# Patient Record
Sex: Female | Born: 1955 | Race: Black or African American | Hispanic: No | Marital: Married | Smoking: Former smoker
Health system: Southern US, Community
[De-identification: ages and names within clinical notes are randomized; demographics above are authoritative.]

## PROBLEM LIST (undated history)

## (undated) ENCOUNTER — Emergency Department (HOSPITAL_COMMUNITY): Admission: EM | Payer: BC Managed Care – PPO | Source: Home / Self Care

## (undated) DIAGNOSIS — C50919 Malignant neoplasm of unspecified site of unspecified female breast: Secondary | ICD-10-CM

## (undated) DIAGNOSIS — N200 Calculus of kidney: Secondary | ICD-10-CM

## (undated) DIAGNOSIS — Z9010 Acquired absence of unspecified breast and nipple: Secondary | ICD-10-CM

## (undated) DIAGNOSIS — G8929 Other chronic pain: Secondary | ICD-10-CM

## (undated) DIAGNOSIS — D649 Anemia, unspecified: Secondary | ICD-10-CM

## (undated) DIAGNOSIS — I10 Essential (primary) hypertension: Secondary | ICD-10-CM

## (undated) DIAGNOSIS — T7840XA Allergy, unspecified, initial encounter: Secondary | ICD-10-CM

## (undated) HISTORY — PX: CHOLECYSTECTOMY: SHX55

## (undated) HISTORY — PX: TUBAL LIGATION: SHX77

## (undated) HISTORY — DX: Essential (primary) hypertension: I10

## (undated) HISTORY — PX: BREAST SURGERY: SHX581

## (undated) HISTORY — DX: Malignant neoplasm of unspecified site of unspecified female breast: C50.919

## (undated) HISTORY — DX: Acquired absence of unspecified breast and nipple: Z90.10

## (undated) HISTORY — DX: Allergy, unspecified, initial encounter: T78.40XA

## (undated) HISTORY — DX: Calculus of kidney: N20.0

## (undated) HISTORY — DX: Other chronic pain: G89.29

## (undated) HISTORY — DX: Anemia, unspecified: D64.9

## (undated) HISTORY — PX: OTHER SURGICAL HISTORY: SHX169

## (undated) HISTORY — PX: TOTAL ABDOMINAL HYSTERECTOMY: SHX209

---

## 1992-08-03 HISTORY — PX: LAPAROSCOPIC LYSIS INTESTINAL ADHESIONS: SUR778

## 2003-08-04 HISTORY — PX: COLONOSCOPY: SHX174

## 2011-06-20 ENCOUNTER — Emergency Department (HOSPITAL_COMMUNITY): Payer: Self-pay

## 2011-06-20 ENCOUNTER — Emergency Department (HOSPITAL_COMMUNITY)
Admission: EM | Admit: 2011-06-20 | Discharge: 2011-06-20 | Disposition: A | Discharge status: Home / Self Care | Payer: Self-pay | Attending: Emergency Medicine | Admitting: Emergency Medicine

## 2011-06-20 DIAGNOSIS — N201 Calculus of ureter: Secondary | ICD-10-CM | POA: Insufficient documentation

## 2011-06-20 DIAGNOSIS — R109 Unspecified abdominal pain: Secondary | ICD-10-CM | POA: Insufficient documentation

## 2011-06-20 DIAGNOSIS — N1330 Unspecified hydronephrosis: Secondary | ICD-10-CM | POA: Insufficient documentation

## 2011-06-20 LAB — BASIC METABOLIC PANEL
BUN: 14 mg/dL (ref 6–23)
CO2: 30 mEq/L (ref 19–32)
Calcium: 9.7 mg/dL (ref 8.4–10.5)
Chloride: 95 mEq/L — ABNORMAL LOW (ref 96–112)
Creatinine, Ser: 1.06 mg/dL (ref 0.50–1.10)
GFR calc Af Amer: 67 mL/min — ABNORMAL LOW (ref >90)
GFR calc non Af Amer: 58 mL/min — ABNORMAL LOW (ref >90)
Glucose, Bld: 173 mg/dL — ABNORMAL HIGH (ref 70–99)
Potassium: 3.1 mEq/L — ABNORMAL LOW (ref 3.5–5.1)
Sodium: 135 mEq/L (ref 135–145)

## 2011-06-20 LAB — URINALYSIS, ROUTINE W REFLEX MICROSCOPIC
Bilirubin Urine: NEGATIVE
Glucose, UA: NEGATIVE mg/dL
Hgb urine dipstick: NEGATIVE
Ketones, ur: NEGATIVE mg/dL
Leukocytes, UA: NEGATIVE
Nitrite: NEGATIVE
Protein, ur: NEGATIVE mg/dL
Specific Gravity, Urine: 1.028 (ref 1.005–1.030)
Urobilinogen, UA: 1 mg/dL (ref 0.0–1.0)
pH: 6 (ref 5.0–8.0)

## 2011-06-20 LAB — CBC
HCT: 34 % — ABNORMAL LOW (ref 36.0–46.0)
Hemoglobin: 11.2 g/dL — ABNORMAL LOW (ref 12.0–15.0)
MCH: 27.1 pg (ref 26.0–34.0)
MCHC: 32.9 g/dL (ref 30.0–36.0)
MCV: 82.3 fL (ref 78.0–100.0)
Platelets: 196 10*3/uL (ref 150–400)
RBC: 4.13 MIL/uL (ref 3.87–5.11)
RDW: 14.3 % (ref 11.5–15.5)
WBC: 8.5 10*3/uL (ref 4.0–10.5)

## 2011-06-20 MED ORDER — OXYCODONE-ACETAMINOPHEN 5-325 MG PO TABS: 1.0000 | ORAL_TABLET | ORAL | Status: AC | PRN

## 2011-06-20 MED ORDER — ONDANSETRON HCL 4 MG/2ML IJ SOLN
4.0000 mg | Freq: Once | INTRAMUSCULAR | Status: AC
  Administered 2011-06-20: 4 mg via INTRAVENOUS
  Filled 2011-06-20: qty 2

## 2011-06-20 MED ORDER — ONDANSETRON HCL 4 MG PO TABS
4.0000 mg | ORAL_TABLET | Freq: Four times a day (QID) | ORAL | Status: AC
Start: 2011-06-20 — End: 2011-06-27

## 2011-06-20 MED ORDER — KETOROLAC TROMETHAMINE 30 MG/ML IJ SOLN
INTRAMUSCULAR | Status: AC
  Administered 2011-06-20: 15 mg
  Filled 2011-06-20: qty 1

## 2011-06-20 MED ORDER — POTASSIUM CHLORIDE CRYS ER 20 MEQ PO TBCR
40.0000 meq | EXTENDED_RELEASE_TABLET | Freq: Once | ORAL | Status: AC
  Administered 2011-06-20: 40 meq via ORAL
  Filled 2011-06-20: qty 2

## 2011-06-20 MED ORDER — HYDROMORPHONE HCL PF 1 MG/ML IJ SOLN: 1.0000 mg | Freq: Once | INTRAMUSCULAR | Status: DC

## 2011-06-20 MED ORDER — SODIUM CHLORIDE 0.9 % IV BOLUS (SEPSIS)
1000.0000 mL | Freq: Once | INTRAVENOUS | Status: AC
  Administered 2011-06-20: 1000 mL via INTRAVENOUS

## 2011-06-20 MED ORDER — KETOROLAC TROMETHAMINE 15 MG/ML IJ SOLN
15.0000 mg | INTRAMUSCULAR | Status: DC
  Filled 2011-06-20: qty 1

## 2011-06-20 NOTE — ED Provider Notes (Signed)
History    55yF with R flank and RLQ pain. Onset Saturday. Initially vomiting which resolved after first day. Occasional radiation to back. Relatively constant without appreciable exacerbating or relieving factors. Intermittent nausea since then though. No fever or chills. Went to an [facility] and tx'd presumptively as kidney stone. Prescribed pain meds. Called them on Wednesday because of continued symptoms and prescribed cipro which has been taking, now on day 3. No hx of similar pain. Pt denies having urinary symptoms though then or now. No vaginal bleeding or discharge. Multiple abdominal surgeries including c-section x3, hysterectomy, chole. No recent procedures.  CSN: [id number] Arrival date & time: 06/20/2011 12:39 PM   First MD Initiated Contact with Patient 06/20/11 1509      Chief Complaint  Patient presents with  . Flank Pain    right flank pain and nausea states onset last week states was told she had a kidney stone denies difficulty urinating  . Nausea    (Consider location/radiation/quality/duration/timing/severity/associated sxs/prior treatment) HPI  Past Medical History  Diagnosis Date  . Cancer     Past Surgical History  Procedure Date  . Cholecystectomy   . Breast surgery     No family history on file.  History  Substance Use Topics  . Smoking status: Never Smoker   . Smokeless tobacco: Not on file  . Alcohol Use: No    OB History    Grav Para Term Preterm Abortions TAB SAB Ect Mult Living                  Review of Systems  Review of symptoms negative unless otherwise noted in HPI.  Allergies  Codeine and Iodine  Home Medications   Current Outpatient Rx  Name Route Sig Dispense Refill  . ATENOLOL 50 MG PO TABS Oral Take 50 mg by mouth daily.      Marland Kitchen CIPROFLOXACIN HCL 500 MG PO TABS Oral Take 500 mg by mouth 2 (two) times daily. Started 14 day course on 06/18/11     . LOSARTAN POTASSIUM-HCTZ 100-25 MG PO TABS Oral Take 1 tablet by mouth daily.        . TRAMADOL HCL 50 MG PO TABS Oral Take 50 mg by mouth every 6 (six) hours as needed. Pain..Maximum dose= 8 tablets per day     . OXYCODONE-ACETAMINOPHEN 5-325 MG PO TABS Oral Take 1 tablet by mouth every 4 (four) hours as needed for pain. 10 tablet 0    BP 104/63  Pulse 74  Temp(Src) 98.5 F (36.9 C) (Oral)  Resp 19  SpO2 100%  Physical Exam  Nursing note and vitals reviewed. Constitutional: She appears well-developed and well-nourished. No distress.       obese  HENT:  Head: Normocephalic and atraumatic.  Eyes: Conjunctivae are normal. Right eye exhibits no discharge. Left eye exhibits no discharge.  Neck: Neck supple.  Cardiovascular: Normal rate, regular rhythm and normal heart sounds.  Exam reveals no gallop and no friction rub.   No murmur heard. Pulmonary/Chest: Effort normal and breath sounds normal. No respiratory distress.  Abdominal: Soft. She exhibits no distension and no mass. There is no tenderness.  Genitourinary:       No cva tenderness  Musculoskeletal: She exhibits no edema and no tenderness.  Neurological: She is alert.  Skin: Skin is warm and dry.  Psychiatric: She has a normal mood and affect. Her behavior is normal. Thought content normal.    ED Course  Procedures (including critical care time)  Labs Reviewed  URINALYSIS, ROUTINE W REFLEX MICROSCOPIC - Abnormal; Notable for the following:    Appearance CLOUDY (*)    All other components within normal limits  CBC - Abnormal; Notable for the following:    Hemoglobin 11.2 (*)    HCT 34.0 (*)    All other components within normal limits  BASIC METABOLIC PANEL - Abnormal; Notable for the following:    Potassium 3.1 (*)    Chloride 95 (*)    Glucose, Bld 173 (*)    GFR calc non Af Amer 58 (*)    GFR calc Af Amer 67 (*)    All other components within normal limits   Ct Abdomen Pelvis W Contrast  06/20/2011  *RADIOLOGY REPORT*  Clinical Data:  Right flank pain.  Renal colic.  Nausea.  CT OF THE  ABDOMEN AND PELVIS WITHOUT CONTRAST (CT UROGRAM)  Technique:  Multidetector CT imaging was performed through the abdomen and pelvis to include the urinary tract.  Comparison:  None  Findings:  Mild right hydronephrosis and ureterectasis is seen. The 3 mm distal right ureteral calculus is seen.  A small less than 5 mm intrarenal calculus is also seen in the lower pole of the right kidney.  No evidence of left renal or ureteral calculi or left-sided hydronephrosis.  The other abdominal parenchymal organs have a normal appearance on this noncontrast study.  Surgical clips seen from prior cholecystectomy.  Uterus and adnexa are unremarkable.  No evidence of mass or inflammatory process.  IMPRESSION:  1.  Mild right hydronephrosis and ureterectasis due to a 3 mm distal right ureteral calculus. 2.  Nonobstructing right nephrolithiasis.  Original Report Authenticated By: Danae Orleans, M.D.     1. Hydronephrosis   2. Abdominal pain   3. Ureteral stone       MDM  55yf with flank pain. Likely renal colic. 3mm nonobstructing stone at UVJ. Given size anticipate spontaneous passage. UA with no evidence of infection. Renal function normal. Pain adequately controlled. Plan pain meds and expectant management. Follow-up with urology as needed.      Raeford Razor, MD 06/20/11 [phone]

## 2011-12-17 ENCOUNTER — Ambulatory Visit: Payer: Self-pay | Admitting: Family Medicine

## 2011-12-17 ENCOUNTER — Encounter: Payer: Self-pay | Admitting: Family Medicine

## 2011-12-17 DIAGNOSIS — I10 Essential (primary) hypertension: Secondary | ICD-10-CM | POA: Insufficient documentation

## 2011-12-17 DIAGNOSIS — E119 Type 2 diabetes mellitus without complications: Secondary | ICD-10-CM | POA: Insufficient documentation

## 2011-12-17 DIAGNOSIS — J45909 Unspecified asthma, uncomplicated: Secondary | ICD-10-CM | POA: Insufficient documentation

## 2012-01-27 ENCOUNTER — Ambulatory Visit (INDEPENDENT_AMBULATORY_CARE_PROVIDER_SITE_OTHER): Payer: BC Managed Care – PPO | Admitting: Family Medicine

## 2012-01-27 ENCOUNTER — Other Ambulatory Visit: Payer: Self-pay | Admitting: Family Medicine

## 2012-01-27 ENCOUNTER — Encounter: Payer: Self-pay | Admitting: Family Medicine

## 2012-01-27 VITALS — BP 128/83 | HR 92 | Ht 63.0 in | Wt 187.0 lb

## 2012-01-27 DIAGNOSIS — I10 Essential (primary) hypertension: Secondary | ICD-10-CM

## 2012-01-27 DIAGNOSIS — IMO0001 Reserved for inherently not codable concepts without codable children: Secondary | ICD-10-CM

## 2012-01-27 DIAGNOSIS — R1032 Left lower quadrant pain: Secondary | ICD-10-CM

## 2012-01-27 DIAGNOSIS — E119 Type 2 diabetes mellitus without complications: Secondary | ICD-10-CM

## 2012-01-27 DIAGNOSIS — C50919 Malignant neoplasm of unspecified site of unspecified female breast: Secondary | ICD-10-CM

## 2012-01-27 NOTE — Patient Instructions (Signed)
Dear Erica Campbell,   It was great to see you today. Thank you for coming to clinic. Please read below regarding the issues that we discussed.   1. For your pelvic pain that seems to be related to your back pain, I want to get x-rays of your spine and hips. You can go to [place] at the front entrance to do these on Friday.  2. We are going to try to get records.   Please follow up in clinic in 1-2 weeks to discuss the results and to do more of your chronic health maintenance. Please call earlier if you have any questions or concerns.   Sincerely,  Dr. Tana Conch

## 2012-01-28 DIAGNOSIS — Z853 Personal history of malignant neoplasm of breast: Secondary | ICD-10-CM | POA: Insufficient documentation

## 2012-01-28 NOTE — Assessment & Plan Note (Signed)
Well controlled at this time. Will follow 

## 2012-01-28 NOTE — Assessment & Plan Note (Signed)
Unclear origin. Appears likely MSK given worsened on days after exertional activity. Also seems linked to back pain-will obtain x-rays of spine and hip. Doubt gyn issue given hysterectomy. Benign abdominal exam. Patient concerned about spleen but looking at CT from when patient had a kidney stone in last year-no abnormalities noted.   INtended to obtain records-will need to on future visit.

## 2012-01-28 NOTE — Progress Notes (Signed)
  Subjective:    Patient ID: Erica Campbell, female    DOB: 1955/08/31, 56 y.o.   MRN: [id number]  HPI Patient here for new patient visit. Primary concern is left back/groin pain.  1. LLQ pain/groin pain. atient states that she has been experiencing discomfort in her pelvis/groin that started  2 years when she moved to [facility]. She has had left sided back pain for a period longer than this. She describes pain in an aching/annoying 3-4/10 pain in her LLQ/groin. SHe thinks the pain may start in her back at times and radiate around to her groin. Pain is intermittent but she feels it at least once a day when she is resting in the evening. She says if she is busy/occupied at work she doesn't notice it. Worsened on days after physical exertion but not during exertion. Also worsened by constipation. Patient has a hysterectomy so does not have mesntrual period association nor abnormal bleeding. She does say sex can be uncomfortable at times but uncertain if this has been going on longer than pelvic pain. 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 Patient also states that she has been under a lot of stress and that she is more aware of pain when she is stressed.   2. DM-diagnosed within last 2 years. Just started metformin 2 weeks ago after visiting an urgent care. a1c 7.5. Patient not tolerating metformin given her abdominal pain. She says it causes blaoating which makes her uncomfortable and worsens pain. SHe does have a BM everyday on the metformin though so constipation not worsening above pain. Was started on 500mg  BID.   3. HTN-complaint with medications. Patient surprised it is controlled today but excited.   Review of Systems -See HPI  Past Medical History-smoking status noted: 3 pack years quit over 30 years ago Updated records with new patient information in Epic.  Marland Kitchen Reviewed problem list.    Medications- reviewed and updated Chief complaint-noted    Objective:   Physical Exam  Constitutional: She is oriented to person, place, and time. She appears well-developed and well-nourished. No distress.  Eyes: Conjunctivae and EOM are normal. Pupils are equal, round, and reactive to light.  Neck: Normal range of motion. Neck supple.  Cardiovascular: Normal rate and regular rhythm.  Exam reveals no gallop and no friction rub.   No murmur heard. Pulmonary/Chest: Effort normal and breath sounds normal. She has no wheezes. She has no rales.  Abdominal: Soft. Bowel sounds are normal. She exhibits no distension.       No tenderness with deep palpation.   Musculoskeletal: Normal range of motion. She exhibits no edema.       No pain with hip flexion, internal or external rotation.   Neurological: She is alert and oriented to person, place, and time.  Skin: Skin is warm and dry.       Assessment & Plan:

## 2012-01-28 NOTE — Assessment & Plan Note (Signed)
Instructed patient to stop taking metformin for 1 week to see if this helps with pain. SHe is to restart medicine at 500 mg once a day then follow up with me and we may titrate up to BID. Will need to assess if patient has had DM education at next visit. A1c with reasonable control at 7.5 but goal of 7 or less in this patient.

## 2012-06-17 ENCOUNTER — Ambulatory Visit (INDEPENDENT_AMBULATORY_CARE_PROVIDER_SITE_OTHER): Payer: BC Managed Care – PPO | Admitting: Family Medicine

## 2012-06-17 ENCOUNTER — Encounter: Payer: Self-pay | Admitting: Family Medicine

## 2012-06-17 VITALS — BP 139/81 | HR 93 | Temp 99.3°F | Ht 63.0 in | Wt 192.0 lb

## 2012-06-17 DIAGNOSIS — J329 Chronic sinusitis, unspecified: Secondary | ICD-10-CM | POA: Insufficient documentation

## 2012-06-17 MED ORDER — AMOXICILLIN 500 MG PO CAPS: 500.0000 mg | ORAL_CAPSULE | Freq: Two times a day (BID) | ORAL | Status: DC

## 2012-06-17 NOTE — Patient Instructions (Addendum)
See info on sinusitis Make follow-up for follow-up medical care with your primary doctor I recommend  A flu shot for you every year

## 2012-06-17 NOTE — Progress Notes (Signed)
  Subjective:    Patient ID: Erica Campbell, female    DOB: 1956-05-24, 56 y.o.   MRN: [id number]  HPI  7 week of sinus pressure, worsening in past day  Felt low grade fever.  Fatigue, dyspnea, wheezing, eye and ear pain.  Sore throat.  + cough, mild.  Is most bothered by eye and face pressure.  oOtes nasal congestion rhinorrhea left greater than right.  I have reviewed patient's  PMH, FH, and Social history and Medications as related to this visit. Asthma:  Non smoker, no flu shot.    Review of Systemssee hpi     Objective:   Physical Exam  GEN: Alert & Oriented, No acute distress HEENT: [place]/AT. EOMI, PERRLA, no conjunctival injection or scleral icterus.  Bilateral tympanic membranes intact without erythema or effusion.  .  Nares without edema or rhinorrhea.  Oropharynx is without erythema or exudates.  No anterior or posterior cervical lymphadenopathy. CV:  Regular Rate & Rhythm, no murmur Respiratory:  Normal work of breathing, CTAB        Assessment & Plan:

## 2012-06-17 NOTE — Assessment & Plan Note (Signed)
Sinusitis with post nasal drainage causing cough.  Given 7 day duration and now worsening instead of improving, course of amoxicillin + symptomatic therapy.  Given handout on sinusitis

## 2012-11-21 ENCOUNTER — Encounter: Payer: Self-pay | Admitting: Family Medicine

## 2012-11-22 ENCOUNTER — Ambulatory Visit (INDEPENDENT_AMBULATORY_CARE_PROVIDER_SITE_OTHER): Payer: BC Managed Care – PPO | Admitting: Family Medicine

## 2012-11-22 ENCOUNTER — Encounter: Payer: Self-pay | Admitting: Family Medicine

## 2012-11-22 VITALS — BP 133/84 | HR 93 | Temp 98.6°F | Wt 189.0 lb

## 2012-11-22 DIAGNOSIS — I10 Essential (primary) hypertension: Secondary | ICD-10-CM

## 2012-11-22 DIAGNOSIS — E785 Hyperlipidemia, unspecified: Secondary | ICD-10-CM

## 2012-11-22 DIAGNOSIS — R5381 Other malaise: Secondary | ICD-10-CM

## 2012-11-22 DIAGNOSIS — R5383 Other fatigue: Secondary | ICD-10-CM

## 2012-11-22 DIAGNOSIS — E559 Vitamin D deficiency, unspecified: Secondary | ICD-10-CM

## 2012-11-22 DIAGNOSIS — E119 Type 2 diabetes mellitus without complications: Secondary | ICD-10-CM

## 2012-11-22 DIAGNOSIS — J45909 Unspecified asthma, uncomplicated: Secondary | ICD-10-CM

## 2012-11-22 DIAGNOSIS — J309 Allergic rhinitis, unspecified: Secondary | ICD-10-CM | POA: Insufficient documentation

## 2012-11-22 DIAGNOSIS — Z8639 Personal history of other endocrine, nutritional and metabolic disease: Secondary | ICD-10-CM

## 2012-11-22 LAB — CBC
HCT: 37.5 % (ref 36.0–46.0)
MCH: 27.2 pg (ref 26.0–34.0)
MCV: 80.8 fL (ref 78.0–100.0)
RDW: 15.4 % (ref 11.5–15.5)
WBC: 6.9 10*3/uL (ref 4.0–10.5)

## 2012-11-22 LAB — TSH: TSH: 1.298 u[IU]/mL (ref 0.350–4.500)

## 2012-11-22 LAB — COMPREHENSIVE METABOLIC PANEL
AST: 20 U/L (ref 0–37)
BUN: 9 mg/dL (ref 6–23)
CO2: 26 mEq/L (ref 19–32)
Calcium: 9.8 mg/dL (ref 8.4–10.5)
Chloride: 99 mEq/L (ref 96–112)
Creat: 0.74 mg/dL (ref 0.50–1.10)

## 2012-11-22 MED ORDER — FEXOFENADINE HCL 60 MG PO TABS: 60.0000 mg | ORAL_TABLET | Freq: Every day | ORAL | Status: DC

## 2012-11-22 MED ORDER — METFORMIN HCL 500 MG PO TABS: 500.0000 mg | ORAL_TABLET | Freq: Two times a day (BID) | ORAL | Status: DC

## 2012-11-22 MED ORDER — ATENOLOL 50 MG PO TABS: 50.0000 mg | ORAL_TABLET | Freq: Every day | ORAL | Status: DC

## 2012-11-22 MED ORDER — LOSARTAN POTASSIUM-HCTZ 100-25 MG PO TABS: 1.0000 | ORAL_TABLET | Freq: Every day | ORAL | Status: DC

## 2012-11-22 MED ORDER — ALBUTEROL SULFATE HFA 108 (90 BASE) MCG/ACT IN AERS: 2.0000 | INHALATION_SPRAY | Freq: Four times a day (QID) | RESPIRATORY_TRACT | Status: DC | PRN

## 2012-11-22 NOTE — Patient Instructions (Signed)
1. For your diabetes.   *we are sending you to diabetes nutrition.   *Start metformin 1/2 tab in the morning and increase to 1 tab in the morning if no abdominal issues. Then in another week increase to 1/2 tab in evening then in another week a full tab until you are taking 1 tab in morning and evening.   *let me know which meter your insurance covers as I want to start you on another medication that can cause low blood sugars (glipizide)  *an aspirin on a daily basis would be good for you due to your diabetes  2. For blood pressure-appears well controlled today. Continue current meds.   3. For asthma, appears well controlled. I refilled your albuterol.  4. For seasonal allergies, we started allegra.   I am checking several labs today and will send you a letter if normal and call if any changes need to be made.   Please see me within 2-3 weeks so we can see how things are going with each of these,  Dr. Durene Cal  P.s. We tried to get records again today.

## 2012-11-23 DIAGNOSIS — Z8639 Personal history of other endocrine, nutritional and metabolic disease: Secondary | ICD-10-CM | POA: Insufficient documentation

## 2012-11-23 DIAGNOSIS — E785 Hyperlipidemia, unspecified: Secondary | ICD-10-CM | POA: Insufficient documentation

## 2012-11-23 LAB — VITAMIN D 25 HYDROXY (VIT D DEFICIENCY, FRACTURES): Vit D, 25-Hydroxy: 22 ng/mL — ABNORMAL LOW (ref 30–89)

## 2012-11-23 MED ORDER — CHOLECALCIFEROL 10 MCG (400 UNIT) PO CAPS: 2.0000 | ORAL_CAPSULE | Freq: Every day | ORAL | Status: DC

## 2012-11-23 MED ORDER — BLOOD GLUCOSE METER KIT: PACK | Status: DC

## 2012-11-23 MED ORDER — GLUCOSE BLOOD VI STRP: ORAL_STRIP | Status: DC

## 2012-11-23 MED ORDER — ACCU-CHEK SOFTCLIX LANCETS MISC: Status: DC

## 2012-11-23 NOTE — Assessment & Plan Note (Signed)
Poorly controlled. Start allegra.

## 2012-11-23 NOTE — Assessment & Plan Note (Signed)
*  start metformin (titrate up from 250 daily)  *send to DM nutrition *patient to let me know which meter her insurance covers then I will order that. After she has it available, will also start glipizide *an aspirin on a daily basis would be good for you due to your diabetes *patient to return in 2-3 weeks to catch up on other DM health maintenance needs *will alert [facility] team due to poorly controlled DM

## 2012-11-23 NOTE — Progress Notes (Signed)
Subjective:   1.  DIABETES Type II Medications taking and tolerating-on no medications, had abdominal discomfort with metformin but was also having chronic abdominal pain about a year ago which has since resolved. Had ordered films last year which patient did not complete.  Blood Sugars per patient-no meter Diet-multiple dietary indescretions Regular Exercise-no   Last eye exam-seen in January of this year, will ask eye doctor to send recores Last foot exam-not within last 2 years  Last microalbumin/on ace inhibitor-on arm Pneumovax-has never had On Aspirin-will start today On statin-LDL 107 and patient considering Daily foot monitoring-yes  ROS- Patient feels that urine output and thirst are at baseline. She does feel overall fatigued for at least the last 3 months though. Denies Vision changes, feet or hand numbness/pain/tingling. Denies  Hypoglycemia symptoms (shaky, sweaty, hungry,  anxious, tremor, palpitations, confusion, behavior change).   Hemoglobin a1c:  Lab Results  Component Value Date   HGBA1C 9.7 11/22/2012   HGBA1C 7.5 01/27/2012   2.  Hypertension- BP Readings from Last 3 Encounters:  11/22/12 133/84  06/17/12 139/81  01/27/12 128/83  Home BP monitoring-no Compliant with medications-yes without side effects (losartan-hctz and atenolol) Denies any CP, HA, SOB, blurry vision, LE edema (currently but does complain of it in the summertime), transient weakness, orthopnea, PND.    3. Asthma/Allergic rhinitis-patient with history of mild intermittent asthma. Uses albuterol max twice a week but this is infrequent and usually uses once a week. No chest pain or SOB or DOE at present. Has run out of albuterol. Seasonal allergies are starting to flare and also had a recent cold so has used 2x per week in last week. Previously on an antihistamine which seemed to help.    ROS--See HPI  Past Medical History Patient Active Problem List  Diagnosis  . Asthma  . Diabetes mellitus  type 2, noninsulin dependent  . HTN (hypertension)  . Groin pain, left lower quadrant  . Breast cancer  . Allergic rhinitis   Reviewed problem list.  Medications- reviewed and updated Chief complaint-noted  Objective: BP 133/84  Pulse 93  Temp(Src) 98.6 F (37 C) (Oral)  Wt 189 lb (85.73 kg)  BMI 33.49 kg/m2 Gen: NAD, resting comfortably CV: RRR no murmurs rubs or gallops Lungs: CTAB no crackles, wheeze, rhonchi Skin: warm, dry Neuro: grossly normal, moves all extremities  Assessment/Plan:  Discussed dependent edema with patient-not major cause for concern without history of CHF and no related DOE, SOB, orthopnea, PND. Advised patient in summer time to use compression stockings, exercise, and elevate legs.   >50% of 30 minute office visit spent on counseling related to worsened DM and distress related to this.

## 2012-11-23 NOTE — Assessment & Plan Note (Signed)
Mild intermittent-well controlled. Refilled albuterol.

## 2012-11-23 NOTE — Assessment & Plan Note (Signed)
Well controlled. Continue current medications. With plan to start glipizide, patient may benefit from coming off atenolol and starting amlodipine to avoid masking symptoms of hypoglycemia.

## 2012-11-23 NOTE — Assessment & Plan Note (Signed)
Patient wants to think over use of statin for now. Recommended lovastatin for patient given DM.

## 2012-12-01 ENCOUNTER — Encounter: Payer: Self-pay | Admitting: Family Medicine

## 2012-12-06 ENCOUNTER — Encounter: Payer: BC Managed Care – PPO | Attending: Family Medicine | Admitting: *Deleted

## 2012-12-06 VITALS — Ht 63.0 in | Wt 188.5 lb

## 2012-12-06 DIAGNOSIS — Z713 Dietary counseling and surveillance: Secondary | ICD-10-CM | POA: Insufficient documentation

## 2012-12-06 DIAGNOSIS — E1165 Type 2 diabetes mellitus with hyperglycemia: Secondary | ICD-10-CM

## 2012-12-06 DIAGNOSIS — E119 Type 2 diabetes mellitus without complications: Secondary | ICD-10-CM | POA: Insufficient documentation

## 2012-12-08 ENCOUNTER — Encounter: Payer: Self-pay | Admitting: Family Medicine

## 2012-12-08 NOTE — Progress Notes (Signed)
Received records from [facility]   Only records included showed normal colonoscopy in 2005 for vague abdominal pain, normal gall bladder pathology from chronic cholecystitis and op note from this procedure.   Appears patient has had vague abdominal pain for sometime but apart from above information, no other workup known. Unclear if patient had pcp at [facility].

## 2012-12-10 ENCOUNTER — Encounter: Payer: Self-pay | Admitting: *Deleted

## 2012-12-10 NOTE — Progress Notes (Signed)
Patient was seen on 12/06/2012 for the first of a series of three diabetes self-management courses [facility]. Patient's most recent A1c was 9.7 % on 11/22/2012 The following learning objectives were met by the patient during this course:   Defines the role of glucose and insulin  Identifies type of diabetes and pathophysiology  Defines the diagnostic criteria for diabetes and prediabetes  States the risk factors for Type 2 Diabetes  States the symptoms of Type 2 Diabetes  Defines Type 2 Diabetes treatment goals  Defines Type 2 Diabetes treatment options  States the rationale for glucose monitoring  Identifies A1C, glucose targets, and testing times  Identifies proper sharps disposal  Defines the purpose of a diabetes food plan  Identifies carbohydrate food groups  Defines effects of carbohydrate foods on glucose levels  Identifies carbohydrate choices/grams/food labels  States benefits of physical activity and effect on glucose  Review of suggested activity guidelines  Handouts given during class include:  Type 2 Diabetes: Basics Book  My Food Plan Book  Food and Activity Log   Follow-Up Plan: Core Class 2

## 2012-12-10 NOTE — Patient Instructions (Signed)
Goals:  Follow Diabetes Meal Plan as instructed  Eat 3 meals and 2 snacks, every 3-5 hrs  Limit carbohydrate intake to 30-45 grams carbohydrate/meal  Limit carbohydrate intake to 0-15 grams carbohydrate/snack  Add lean protein foods to meals/snacks  Monitor glucose levels as instructed by your doctor  Aim for 15-30 mins of physical activity daily  Bring food record and glucose log to your next nutrition visit   

## 2012-12-22 NOTE — Progress Notes (Signed)
Patient is currently enrolled in DM education classes with [facility].  Once she is finished if she feels she needs continued support please refer to health coach for support.

## 2013-01-07 DIAGNOSIS — I10 Essential (primary) hypertension: Secondary | ICD-10-CM | POA: Insufficient documentation

## 2013-01-10 ENCOUNTER — Encounter: Payer: BC Managed Care – PPO | Attending: Family Medicine | Admitting: *Deleted

## 2013-01-10 DIAGNOSIS — E119 Type 2 diabetes mellitus without complications: Secondary | ICD-10-CM | POA: Insufficient documentation

## 2013-01-10 DIAGNOSIS — Z713 Dietary counseling and surveillance: Secondary | ICD-10-CM | POA: Insufficient documentation

## 2013-01-11 NOTE — Progress Notes (Signed)
  Patient was seen on 01/10/2013 for the second of a series of three diabetes self-management courses at [facility]. The following learning objectives were met by the patient during this course:   Explain basic nutrition maintenance and quality assurance  Describe causes, symptoms and treatment of hypoglycemia and hyperglycemia  Explain how to manage diabetes during illness  Describe the importance of good nutrition for health and healthy eating strategies  List strategies to follow meal plan when dining out  Describe the effects of alcohol on glucose and how to use it safely  Describe problem solving skills for day-to-day glucose challenges  Describe strategies to use when treatment plan needs to change  Identify important factors involved in successful weight loss  Describe ways to remain physically active  Describe the impact of regular activity on insulin resistance    Handouts given in class:  Refrigerator magnet for Sick Day Guidelines  [facility] Oral medication/insulin handout  Follow-Up Plan: Patient will attend the final class of the ADA Diabetes Self-Care Education.    

## 2013-01-13 ENCOUNTER — Encounter: Payer: Self-pay | Admitting: Family Medicine

## 2013-01-13 ENCOUNTER — Ambulatory Visit (INDEPENDENT_AMBULATORY_CARE_PROVIDER_SITE_OTHER): Payer: BC Managed Care – PPO | Admitting: Family Medicine

## 2013-01-13 VITALS — BP 128/84 | HR 101 | Ht 63.0 in | Wt 181.7 lb

## 2013-01-13 DIAGNOSIS — E119 Type 2 diabetes mellitus without complications: Secondary | ICD-10-CM

## 2013-01-13 DIAGNOSIS — E1165 Type 2 diabetes mellitus with hyperglycemia: Secondary | ICD-10-CM

## 2013-01-13 DIAGNOSIS — I10 Essential (primary) hypertension: Secondary | ICD-10-CM

## 2013-01-13 DIAGNOSIS — E785 Hyperlipidemia, unspecified: Secondary | ICD-10-CM

## 2013-01-13 DIAGNOSIS — Z23 Encounter for immunization: Secondary | ICD-10-CM

## 2013-01-13 MED ORDER — LOVASTATIN 20 MG PO TABS: 40.0000 mg | ORAL_TABLET | Freq: Every day | ORAL | Status: DC

## 2013-01-13 NOTE — Assessment & Plan Note (Signed)
After lengthy discussion on CV risk, patient agreeable to start statin. Lovastatin sent to [facility] (at 40mg  qualifies as moderate intensity statin).

## 2013-01-13 NOTE — Progress Notes (Signed)
Subjective:   1.  DIABETES Type II Medications taking and tolerating-metformin 500mg  twice a day (occasionally misses evening dose)  Blood Sugars per patient-averaging around 160 (checks varying time of day). Over 200 only once in past 3 weeks.  Diet-in diabetes education. Much better food choices. Also has a Writer" who has had DM for 10 years.  Regular Exercise-no   Last eye exam-seen in January of this year, [facility] (will get ROI today) Last foot exam-01/13/13 Last microalbumin/on ace inhibitor-on arb Pneumovax-01/13/13 On Aspirin-yes On statin-will start today Daily foot monitoring-yes  ROS- denies polyuria and polydipsia. No blurry vision. No feet or hand numbness/pain/tingling. Denies  Hypoglycemia symptoms (shaky, sweaty, hungry,  anxious, tremor, palpitations, confusion, behavior change).   Hemoglobin a1c:  Lab Results  Component Value Date   HGBA1C 9.7 11/22/2012   HGBA1C 7.5 01/27/2012   Health Maintenance Due  Topic Date Due  . Pneumococcal Polysaccharide Vaccine (#1)-today 03/24/1957  . Foot Exam -today 03/24/1965  . Ophthalmology Exam -get records 03/24/1965  . Pap Smear -needs to come back for this within a month 03/24/1973  . Tetanus/tdap -today 03/24/1974  . Mammogram -handout given today 03/24/2005   # Hyperlipidemia-last  LDL 107. Patient wanted to consider before starting statin. No chest pain or shortness of breath.   ROS--See HPI  Past Medical History Patient Active Problem List   Diagnosis Date Noted  . Breast cancer     Priority: High  . Poorly controlled type 2 diabetes mellitus     Priority: High  . HTN (hypertension)     Priority: Medium  . Asthma     Priority: Low  . H/O vitamin D deficiency 11/23/2012  . Hyperlipidemia LDL goal < 100 11/23/2012  . Allergic rhinitis 11/22/2012  . Groin pain, left lower quadrant 01/27/2012   Reviewed problem list.  Medications- reviewed and updated Chief complaint-noted  Objective: BP  128/84  Pulse 101  Ht 5\' 3"  (1.6 m)  Wt 181 lb 11.2 oz (82.419 kg)  BMI 32.19 kg/m2 Gen: NAD, resting comfortably in chair CV: RRR no murmurs rubs or gallops Lungs: CTAB no crackles, wheeze, rhonchi Skin: warm, dry Neuro: grossly normal, moves all extremities Ext: no edema Foot exam: monofilament without signs of neuropathy, 2+ DP and PT pulses  Assessment/Plan:  >50% of 30 minute office visit spent on counseling on continued lifestyle changes for diabetes and hyperlipidemia as well as need to minimize CV risk. Also educated patient on topics such as DM health maintenance and calmed her fear on the number of steps we needed to take to maximize her health.

## 2013-01-13 NOTE — Patient Instructions (Addendum)
1. I am glad things are going so well with your diabetes education. See me in Late July and we will repeat your a1c. We may need to increase your medicine at that time but I am hopeful with your continued healthy lifestyle choices we can avoid this.   *remember to look at your feet and lotion them daily  2. Start taking the lovastatin for your cholesterol.   Health Maintenance Due  Topic Date Due  . Pneumococcal Polysaccharide Vaccine (#1)-you got today 03/24/1957  . Foot Exam -you got today 03/24/1965  . Ophthalmology Exam -we will try to get records 03/24/1965  . Pap Smear -please schedule an appointment for a well woman exam including breast exam within the month 03/24/1973  . Tetanus/tdap -you got today 03/24/1974  . Mammogram -please see handout and go get this completed (they send [place] the information) 03/24/2005   See me at my next available for well woman exam and see me in late July for diabetes exam, Dr. Paulette Blanch. i will look through your records soon as I recently received them

## 2013-01-13 NOTE — Assessment & Plan Note (Signed)
Improved control per #s reported (suspect a1c closer to 8). Moderate control. Patient able to tolerate metformin now with slow titration up. COntinue current medication. Has lost almost 10 lbs due to exercise/eating habits. Recheck a1c late July-may need to add glipizide.

## 2013-02-16 ENCOUNTER — Emergency Department (HOSPITAL_COMMUNITY)
Admission: EM | Admit: 2013-02-16 | Discharge: 2013-02-16 | Disposition: A | Discharge status: Home / Self Care | Payer: BC Managed Care – PPO | Attending: Emergency Medicine | Admitting: Emergency Medicine

## 2013-02-16 ENCOUNTER — Encounter (HOSPITAL_COMMUNITY): Payer: Self-pay

## 2013-02-16 DIAGNOSIS — I10 Essential (primary) hypertension: Secondary | ICD-10-CM | POA: Insufficient documentation

## 2013-02-16 DIAGNOSIS — Z9010 Acquired absence of unspecified breast and nipple: Secondary | ICD-10-CM | POA: Insufficient documentation

## 2013-02-16 DIAGNOSIS — Z853 Personal history of malignant neoplasm of breast: Secondary | ICD-10-CM | POA: Insufficient documentation

## 2013-02-16 DIAGNOSIS — K029 Dental caries, unspecified: Secondary | ICD-10-CM | POA: Insufficient documentation

## 2013-02-16 DIAGNOSIS — Z7982 Long term (current) use of aspirin: Secondary | ICD-10-CM | POA: Insufficient documentation

## 2013-02-16 DIAGNOSIS — J45909 Unspecified asthma, uncomplicated: Secondary | ICD-10-CM | POA: Insufficient documentation

## 2013-02-16 DIAGNOSIS — Z862 Personal history of diseases of the blood and blood-forming organs and certain disorders involving the immune mechanism: Secondary | ICD-10-CM | POA: Insufficient documentation

## 2013-02-16 DIAGNOSIS — E119 Type 2 diabetes mellitus without complications: Secondary | ICD-10-CM | POA: Insufficient documentation

## 2013-02-16 DIAGNOSIS — Z87891 Personal history of nicotine dependence: Secondary | ICD-10-CM | POA: Insufficient documentation

## 2013-02-16 DIAGNOSIS — G8929 Other chronic pain: Secondary | ICD-10-CM | POA: Insufficient documentation

## 2013-02-16 DIAGNOSIS — Z87442 Personal history of urinary calculi: Secondary | ICD-10-CM | POA: Insufficient documentation

## 2013-02-16 DIAGNOSIS — Z79899 Other long term (current) drug therapy: Secondary | ICD-10-CM | POA: Insufficient documentation

## 2013-02-16 DIAGNOSIS — Z8709 Personal history of other diseases of the respiratory system: Secondary | ICD-10-CM | POA: Insufficient documentation

## 2013-02-16 MED ORDER — TRAMADOL HCL 50 MG PO TABS: 50.0000 mg | ORAL_TABLET | Freq: Four times a day (QID) | ORAL | Status: DC | PRN

## 2013-02-16 MED ORDER — AMOXICILLIN 500 MG PO CAPS: 500.0000 mg | ORAL_CAPSULE | Freq: Three times a day (TID) | ORAL | Status: DC

## 2013-02-16 NOTE — ED Notes (Signed)
Pt. C/o dental pain x1 year.

## 2013-02-16 NOTE — ED Provider Notes (Signed)
Medical screening examination/treatment/procedure(s) were performed by non-physician practitioner and as supervising physician I was immediately available for consultation/collaboration.   Amara Manalang Ann Izella Ybanez, MD 02/16/13 2311 

## 2013-02-16 NOTE — ED Notes (Signed)
Pt. C/o dental pain from bilateral lower molars for approx 1 year on left side and 6 months on right side. Pt. Has cracked teeth and swollen gums. Pt. States she is "new to the area" and would like a referral to a dentist. Pt. Reports constant dull pain and increasing pain with chewing.

## 2013-02-16 NOTE — ED Provider Notes (Signed)
History    This chart was scribed for Wynetta Emery, non-physician practitioner working with Ashby Dawes, MD by Leone Payor, ED Scribe. This patient was seen in room TR09C/TR09C and the patient's care was started at 1924.  CSN: [id number] Arrival date & time 02/16/13  1924  First MD Initiated Contact with Patient 02/16/13 1932     Chief Complaint  Patient presents with  . Dental Pain    The history is provided by the patient. No language interpreter was used.    HPI Comments: Erica Campbell is a 57 y.o. female who presents to the Emergency Department complaining of ongoing, constant, gradually worsening bilateral lower molar dental pain for 1 year. States she has cracked teeth and has had tolerable pain up until a few days ago. Pt rates the pain as 3-4/10 at baseline but is excruciating when she attempts to eat anything. She has taken a dose of tramadol for pain with moderate relief. She is mostly concerned about a potential infection. She denies fever, nausea, vomiting.   Past Medical History  Diagnosis Date  . Breast cancer     1999 s/p mastectomy right breast only.   . Allergic rhinitis   . Anemia     only in past, not current problem  . Asthma     since 1978  . Chronic pain     pelvic  . Diabetes mellitus     2010  . HTN (hypertension)     1993  . Kidney stone     2012  . H/O mastectomy    Past Surgical History  Procedure Laterality Date  . Cholecystectomy      2007-chronic cholecystitis with cholelithiasis  . Breast surgery    . Total abdominal hysterectomy      with cervix left only.   . Tubal ligation      1981  . Cesarean section      x3  . H/o mastectomy     Family History  Problem Relation Age of Onset  . Asthma Mother   . Endometrial cancer      paternal grandmother  . Colon cancer      paternal aunt  . Hypertension Mother    History  Substance Use Topics  . Smoking status: Former Smoker -- 3 years    Quit date: 04/29/1975  .  Smokeless tobacco: Not on file  . Alcohol Use: Yes     Comment: special occasions only   OB History   Grav Para Term Preterm Abortions TAB SAB Ect Mult Living                 Review of Systems  Constitutional: Negative for fever.  HENT: Positive for dental problem. Negative for facial swelling.   Respiratory: Negative for shortness of breath.   Cardiovascular: Negative for chest pain.  Gastrointestinal: Negative for nausea, vomiting, abdominal pain and diarrhea.  All other systems reviewed and are negative.    Allergies  Codeine and Iodine  Home Medications   Current Outpatient Rx  Name  Route  Sig  Dispense  Refill  . albuterol (PROVENTIL HFA;VENTOLIN HFA) 108 (90 BASE) MCG/ACT inhaler   Inhalation   Inhale 2 puffs into the lungs every 6 (six) hours as needed for wheezing.   1 Inhaler   3   . aspirin 81 MG tablet   Oral   Take 81 mg by mouth daily.         Marland Kitchen atenolol (TENORMIN) 50 MG tablet  Oral   Take 1 tablet (50 mg total) by mouth daily.   30 tablet   11   . ACCU-CHEK SOFTCLIX LANCETS lancets      Check each morning then as needed for low blood sugar. Can be changed to relion or accu-chek lancets per insurance needs.   100 each   12     Can be changed to relion or accu-chek lancets per  ...   . Blood Glucose Monitoring Suppl (BLOOD GLUCOSE METER) kit      Check if symptoms low blood sugar. 1 meter-can be changed to relion or accu-chek meter per insurance needs.   1 each   0     1 meter-can be changed to relion or accu-chek mete ...   . fexofenadine (ALLEGRA) 60 MG tablet   Oral   Take 1 tablet (60 mg total) by mouth daily.   30 tablet   5   . glucose blood (ACCU-CHEK ACTIVE STRIPS) test strip      Check each morning then as needed for low blood sugar. Can be changed to relion or accu-chek strips per insurance needs.   100 each   12     Can be changed to relion or accu-chek strips per i ...   . losartan-hydrochlorothiazide (HYZAAR) 100-25  MG per tablet   Oral   Take 1 tablet by mouth daily.   30 tablet   11   . lovastatin (MEVACOR) 20 MG tablet   Oral   Take 2 tablets (40 mg total) by mouth at bedtime.   90 tablet   8     May substitute 40mg  pills (#30) if covered by insu ...   . metFORMIN (GLUCOPHAGE) 500 MG tablet   Oral   Take 1 tablet (500 mg total) by mouth 2 (two) times daily with a meal. Start 0.5 tab once daily with breakfast. Start full tab in 1 week   60 tablet   11    There were no vitals taken for this visit. Physical Exam  Nursing note and vitals reviewed. Constitutional: She is oriented to person, place, and time. She appears well-developed and well-nourished. No distress.  HENT:  Head: Normocephalic.  Mouth/Throat:    Generally poor dentition, no gingival swelling, erythema or tenderness to palpation. Patient is handling their secretions. There is no tenderness to palpation or firmness underneath tongue bilaterally. No trismus.    Eyes: Conjunctivae and EOM are normal.  Cardiovascular: Normal rate.   Pulmonary/Chest: Effort normal. No stridor.  Musculoskeletal: Normal range of motion.  Neurological: She is alert and oriented to person, place, and time.  Psychiatric: She has a normal mood and affect.    ED Course  Procedures (including critical care time)  DIAGNOSTIC STUDIES: None performed.   COORDINATION OF CARE: 7:49 PM Discussed treatment plan with pt at bedside and pt agreed to plan.   Labs Reviewed - No data to display No results found. No diagnosis found.  MDM   Filed Vitals:   02/16/13 1954  BP: 144/89  Pulse: 86  Temp: 99.4 F (37.4 C)  TempSrc: Oral  Resp: 16  SpO2: 98%     Erica Campbell is a 57 y.o. female Patient with toothache.  No gross abscess.  Exam unconcerning for Ludwig's angina or spread of infection.  Will treat with penicillin and pain medicine.  Urged patient to follow-up with dentist.    Pt is hemodynamically stable, appropriate for, and amenable  to discharge at this time. Pt verbalized  understanding and agrees with care plan. Outpatient follow-up and specific return precautions discussed.    New Prescriptions   AMOXICILLIN (AMOXIL) 500 MG CAPSULE    Take 1 capsule (500 mg total) by mouth 3 (three) times daily.   TRAMADOL (ULTRAM) 50 MG TABLET    Take 1 tablet (50 mg total) by mouth every 6 (six) hours as needed for pain.     Wynetta Emery, PA-C 02/16/13 2017

## 2013-04-26 ENCOUNTER — Telehealth: Payer: Self-pay | Admitting: Family Medicine

## 2013-04-26 DIAGNOSIS — E119 Type 2 diabetes mellitus without complications: Secondary | ICD-10-CM

## 2013-04-26 MED ORDER — GLUCOSE BLOOD VI STRP: ORAL_STRIP | Status: DC

## 2013-04-26 NOTE — Telephone Encounter (Signed)
Will forward to [place]. Mika Anastasi,CMA  

## 2013-04-26 NOTE — Telephone Encounter (Signed)
Pt called and would like a refill on her relon test strips sent to the pharmacy [facility] at [facility]. JW

## 2013-04-26 NOTE — Telephone Encounter (Signed)
Unclear why needed refill 100 strips with 11 refills in April. Refilled with year supply regardless.

## 2013-04-27 ENCOUNTER — Other Ambulatory Visit: Payer: Self-pay

## 2013-04-27 DIAGNOSIS — Z853 Personal history of malignant neoplasm of breast: Secondary | ICD-10-CM

## 2013-04-27 DIAGNOSIS — Z9011 Acquired absence of right breast and nipple: Secondary | ICD-10-CM

## 2013-04-27 DIAGNOSIS — Z1231 Encounter for screening mammogram for malignant neoplasm of breast: Secondary | ICD-10-CM

## 2013-05-18 ENCOUNTER — Telehealth: Payer: Self-pay | Admitting: Family Medicine

## 2013-05-18 NOTE — Telephone Encounter (Signed)
Pertinent information was scanned in on 10/7. It is oddly scanned in with a blank sheet on every other sheet when I did not include this to be scanned. I do not see a copy of her mammogram but the breast cancer can review pertinent information or repeat request specifically for mammograms.

## 2013-05-18 NOTE — Telephone Encounter (Signed)
Pt states that she had her records faxed to [place] from previous [place].  Do you have these on you>  Erica Campbell,CMA

## 2013-05-18 NOTE — Telephone Encounter (Signed)
Patient calls needing last mammogram sent to [facility]. Her appt is scheduled for 05/23/13 @ 2pm.

## 2013-05-18 NOTE — Telephone Encounter (Signed)
Pt's husband stopped by to see did Dr. Durene Cal receive the [facility] image from [facility].

## 2013-05-19 NOTE — Telephone Encounter (Signed)
Spoke with pt's husband and let him know message from Dr. Durene Cal.  He is aware and will inform pt. Advised them to call office where records are from and get mammogram sent to breast center. Jazmin Hartsell,CMA

## 2013-05-23 ENCOUNTER — Ambulatory Visit
Admission: RE | Admit: 2013-05-23 | Discharge: 2013-05-23 | Disposition: A | Discharge status: Home / Self Care | Payer: BC Managed Care – PPO | Source: Ambulatory Visit

## 2013-05-23 ENCOUNTER — Encounter: Payer: BC Managed Care – PPO | Attending: Family Medicine

## 2013-05-23 DIAGNOSIS — E1165 Type 2 diabetes mellitus with hyperglycemia: Secondary | ICD-10-CM

## 2013-05-23 DIAGNOSIS — Z1231 Encounter for screening mammogram for malignant neoplasm of breast: Secondary | ICD-10-CM

## 2013-05-23 DIAGNOSIS — E119 Type 2 diabetes mellitus without complications: Secondary | ICD-10-CM | POA: Insufficient documentation

## 2013-05-23 DIAGNOSIS — Z9011 Acquired absence of right breast and nipple: Secondary | ICD-10-CM

## 2013-05-23 DIAGNOSIS — Z853 Personal history of malignant neoplasm of breast: Secondary | ICD-10-CM

## 2013-05-23 DIAGNOSIS — Z713 Dietary counseling and surveillance: Secondary | ICD-10-CM | POA: Insufficient documentation

## 2013-05-29 NOTE — Progress Notes (Signed)
Patient was seen on 05/23/13 for the third of a series of three diabetes self-management courses at [facility]. The following learning objectives were met by the patient during this class:    State the amount of activity recommended for healthy living   Describe activities suitable for individual needs   Identify ways to regularly incorporate activity into daily life   Identify barriers to activity and ways to over come these barriers  Identify diabetes medications being personally used and their primary action for lowering glucose and possible side effects   Describe role of stress on blood glucose and develop strategies to address psychosocial issues   Identify diabetes complications and ways to prevent them  Explain how to manage diabetes during illness   Evaluate success in meeting personal goal   Establish 2-3 goals that they will plan to diligently work on until they return for the free 52-month follow-up visit  Your patient has established the following 4 month goals in their individualized success plan:  Reduce fat in my diet by eating less fried foods at 2 or more meals per week  Increase my activity at least 4 days a week  Test my glucose at least 2 times a day, 5 days a week  To help manage my stress, I will do whatever I want or enjoy (not regarding diet) at least 2 times a week  Your patient has identified these potential barriers to change:  My spouse not fully understanding my commitment- though he probably has it himself  Your patient has identified their diabetes self-care support plan as  I am committed

## 2013-05-29 NOTE — Progress Notes (Deleted)
Patient was seen on 05/23/13 for the third of a series of three diabetes self-management courses at the [facility]. The following learning objectives were met by the patient during this class:    State the amount of activity recommended for healthy living   Describe activities suitable for individual needs   Identify ways to regularly incorporate activity into daily life   Identify barriers to activity and ways to over come these barriers  Identify diabetes medications being personally used and their primary action for lowering glucose and possible side effects   Describe role of stress on blood glucose and develop strategies to address psychosocial issues   Identify diabetes complications and ways to prevent them  Explain how to manage diabetes during illness   Evaluate success in meeting personal goal   Establish 2-3 goals that they will plan to diligently work on until they return for the free 26-month follow-up visit  Your patient has established the following 4 month goals in their individualized success plan:  Count carbohydrates at most meals and snacks and eat less bread at 2 or more meals per day  Increase my activity at least 3 days a week for 30 minutes or more  Test my glucose at least 2 times a day 7 days a week and look for patterns in my record book at least 4 days a month including Sundays  Your patient has identified these potential barriers to change:  Need to reduce the amount of unsuitable foods in the house  Your patient has identified their diabetes self-care support plan as  None stated

## 2013-06-08 ENCOUNTER — Ambulatory Visit (INDEPENDENT_AMBULATORY_CARE_PROVIDER_SITE_OTHER): Payer: BC Managed Care – PPO | Admitting: Family Medicine

## 2013-06-08 ENCOUNTER — Encounter: Payer: Self-pay | Admitting: Family Medicine

## 2013-06-08 VITALS — BP 130/80 | HR 92 | Ht 63.0 in | Wt 179.0 lb

## 2013-06-08 DIAGNOSIS — E119 Type 2 diabetes mellitus without complications: Secondary | ICD-10-CM

## 2013-06-08 DIAGNOSIS — I10 Essential (primary) hypertension: Secondary | ICD-10-CM

## 2013-06-08 DIAGNOSIS — C50911 Malignant neoplasm of unspecified site of right female breast: Secondary | ICD-10-CM

## 2013-06-08 DIAGNOSIS — D139 Benign neoplasm of ill-defined sites within the digestive system: Secondary | ICD-10-CM | POA: Insufficient documentation

## 2013-06-08 DIAGNOSIS — E1165 Type 2 diabetes mellitus with hyperglycemia: Secondary | ICD-10-CM

## 2013-06-08 DIAGNOSIS — Z23 Encounter for immunization: Secondary | ICD-10-CM

## 2013-06-08 DIAGNOSIS — C50919 Malignant neoplasm of unspecified site of unspecified female breast: Secondary | ICD-10-CM

## 2013-06-08 DIAGNOSIS — D1399 Benign neoplasm of ill-defined sites within the digestive system: Secondary | ICD-10-CM | POA: Insufficient documentation

## 2013-06-08 NOTE — Assessment & Plan Note (Signed)
Given 15 years out, unclear benefit of referral to oncology for following breast cancer  but patient would like to be established in the area. In addition, the littoral cell angioma did not have clear guidelines for following so would ask for recommendations in this area.   ROI sent to get prior mammograms as well so that current mammogram can be evaluated.

## 2013-06-08 NOTE — Progress Notes (Signed)
[facility] Tana Conch, MD Phone: [phone]  Subjective:  Chief complaint-noted  # Right sided Breast Cancer Patient in 1999 with a Infiltrative Ductal Carcinoma Grade III T2N0M0 and underwent radical mastectomy on the right with reconstruction and reduction on the left. She udnerwent 4 cycles of chemotherapy but was not placed on tamoxifen.  She states she was never officially released from oncology when she lived in [place]. 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 She recently had a mammogram but this has not been read as there are no priors for comparison. She would like to reestablish care with oncologist in this area.  ROS-no weight loss, fatigue, night sweats  # DIABETES Type II Medications taking and tolerating-yes, metformin 1g total Blood Sugars per patient-fasting-does not typically check Diet-trying to eat healthy Regular Exercise-trying to exercise regularly  Health Maintenance Due  Topic Date Due  . Foot Exam -planned next visit 03/24/1965  . Pap Smear -requests gyn referral and surprised that we offer these, offered her to do this with a female provider if more comfortable 03/24/1973  . Mammogram -pending 03/24/2005  . Influenza Vaccine -today  03/03/2013  . Hemoglobin A1c -today  05/24/2013  On Aspirin-yes On statin-lovastatin Daily foot monitoring-yes  ROS- Denies Polyuria,Polydipsia, nocturia, Vision changes, feet or hand numbness/pain/tingling. Denies  Hypoglycemia symptoms (shaky, sweaty, hungry, weak anxious, tremor, palpitations, confusion, behavior change).   Hemoglobin a1c:  Lab Results  Component Value Date   HGBA1C 6.5 06/08/2013   HGBA1C 9.7 11/22/2012   HGBA1C 7.5 01/27/2012   # Hypertension BP Readings from Last 3 Encounters:  06/08/13 148/85  02/16/13 144/89  01/13/13 128/84   Home BP monitoring-no Compliant with medications-yes without side effects Denies any  chest pain or shortness of breath  Past Medical History Patient Active Problem List   Diagnosis Date Noted  . Breast cancer     Priority: High  . Poorly controlled type 2 diabetes mellitus     Priority: High  . 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 Hyperlipidemia LDL goal < 100 11/23/2012    Priority: Medium  . HTN (hypertension)     Priority: Medium  . H/O vitamin D deficiency 11/23/2012    Priority: Low  . Allergic rhinitis 11/22/2012    Priority: Low  . Asthma     Priority: Low  . Groin pain, left lower quadrant 01/27/2012    Medications- reviewed and updated Current Outpatient Prescriptions on File Prior to Visit  Medication Sig Dispense Refill  . ACCU-CHEK SOFTCLIX LANCETS lancets Check each morning then as needed for low blood sugar. Can be changed to relion or accu-chek lancets per insurance needs.  100 each  12  . albuterol (PROVENTIL HFA;VENTOLIN HFA) 108 (90 BASE) MCG/ACT inhaler Inhale 2 puffs into the lungs every 6 (six) hours as needed for wheezing.  1 Inhaler  3  . amoxicillin (AMOXIL) 500 MG capsule Take 1 capsule (500 mg total) by mouth 3 (three) times daily.  30 capsule  0  . aspirin 81 MG tablet Take 81 mg by mouth daily.      Marland Kitchen atenolol (TENORMIN) 50 MG tablet Take 1 tablet (50 mg total) by mouth daily.  30 tablet  11  . Blood Glucose Monitoring Suppl (BLOOD GLUCOSE METER) kit Check if symptoms low blood sugar. 1 meter-can be changed to relion or accu-chek meter per insurance needs.  1 each  0  . Carboxymethylcellulose Sodium (LUBRICANT EYE DROPS OP) Place 3 drops into both eyes as needed (dry eyes).      . fexofenadine (ALLEGRA) 60 MG tablet Take 1 tablet (60 mg total) by mouth daily.  30 tablet  5  . glucose blood (ACCU-CHEK ACTIVE STRIPS) test strip Check each morning then as needed for low blood sugar. Can be changed to relion or accu-chek strips per insurance needs.  100 each  12  . ibuprofen (ADVIL,MOTRIN) 200 MG tablet Take 800 mg by  mouth every 6 (six) hours as needed for pain.      Marland Kitchen losartan-hydrochlorothiazide (HYZAAR) 100-25 MG per tablet Take 1 tablet by mouth daily.  30 tablet  11  . lovastatin (MEVACOR) 20 MG tablet Take 2 tablets (40 mg total) by mouth at bedtime.  90 tablet  8  . metFORMIN (GLUCOPHAGE) 500 MG tablet Take 1 tablet (500 mg total) by mouth 2 (two) times daily with a meal. Start 0.5 tab once daily with breakfast. Start full tab in 1 week  60 tablet  11  . traMADol (ULTRAM) 50 MG tablet Take 1 tablet (50 mg total) by mouth every 6 (six) hours as needed for pain.  15 tablet  0   No current facility-administered medications on file prior to visit.    Objective: BP 148/85  Pulse 92  Ht 5\' 3"  (1.6 m)  Wt 179 lb (81.194 kg)  BMI 31.72 kg/m2 Gen: NAD, resting comfortably CV: RRR no murmurs rubs or gallops Lungs: CTAB no crackles, wheeze, rhonchi Ext: no edema  Assessment/Plan:

## 2013-06-08 NOTE — Assessment & Plan Note (Addendum)
Isolated systolic hypertension. Patient wants to continue diet and exercise at present. SHe is VERY resistant to an additional medication or alterations.   This has been elevated on last 2 visits on atenolol, losartan-hctz. I do not consider atenolol a strong antihypertensive so I would likely add a calcium channel blocker if needed. Will continue to follow on 3 month basis.   Addendum: repeat by nursing staff shows BP 130/80. Vitals updated. Well controlled based off this value.

## 2013-06-08 NOTE — Assessment & Plan Note (Addendum)
a1c drastically improved  From 9.7 to 6.5 on metformin alone due to healthy lifestyle choices. Will continue metformin and continue patient to encourage her effort.s

## 2013-06-08 NOTE — Patient Instructions (Addendum)
We will refer you to oncology.   I am glad your diabetes is doing better, keep up the hard work. Keep taking your metformin, aspirin.   Please see me within the next month to discuss the following: Health Maintenance Due  Topic Date Due  . Foot Exam  03/24/1965  . Pap Smear  03/24/1973  . Mammogram  03/24/2005  . Influenza Vaccine  03/03/2013  . Hemoglobin A1c  05/24/2013    Thanks, Dr. Durene Cal

## 2013-06-09 ENCOUNTER — Telehealth: Payer: Self-pay | Admitting: Family Medicine

## 2013-06-09 NOTE — Telephone Encounter (Signed)
Erica Campbell called regarding some of the info on her AVS form.  Wanted to have the bp reading changed to the second one taken that same day, showing 130/80, also the status of her diabetes changed from poorly controlled since her A1C reading was better.  And lastly to have the dx for her breast ca changed to hx of.

## 2013-06-11 NOTE — Telephone Encounter (Signed)
Please inform patient that [place] status was updated after her vis. I have updated her blood pressure and changed status to history of breast cancer.

## 2013-06-11 NOTE — Telephone Encounter (Signed)
I ended up calling patient to update her on mammogram which was negative and I informed her of previous message. Please disregard it now. My apologies.

## 2013-09-26 ENCOUNTER — Ambulatory Visit: Payer: BC Managed Care – PPO | Admitting: *Deleted

## 2013-10-26 ENCOUNTER — Other Ambulatory Visit: Payer: Self-pay | Admitting: Family Medicine

## 2013-11-21 ENCOUNTER — Other Ambulatory Visit: Payer: Self-pay | Admitting: Family Medicine

## 2013-12-25 ENCOUNTER — Other Ambulatory Visit: Payer: Self-pay | Admitting: Family Medicine

## 2014-01-10 ENCOUNTER — Encounter: Payer: Self-pay | Admitting: *Deleted

## 2014-01-10 NOTE — Progress Notes (Signed)
Pt in nurse clinic today because she went to her dentist  for an extraction.  They did not do the procedure because her blood pressures were elevated.  Three readings were done 1.  11:07 AM 147/93 2. 11:25 AM 165/104 3. 11:27 AM 164/97.   Pt stated she took medications as prescribed, but has concerns about taking to much potassium.  Pt was giving an antibiotic prescribed by the dentist that contained potassium.  Blood pressure retaken by nurse 148/80 manually, heart rate 88.  Pt denied any chest pain, numbness/tingling of extremities or visual changes.  Will forward to PCP.  Appt made for 01/11/2014.  Derl Barrow, RN

## 2014-01-11 ENCOUNTER — Ambulatory Visit (INDEPENDENT_AMBULATORY_CARE_PROVIDER_SITE_OTHER): Payer: BC Managed Care – PPO | Admitting: Family Medicine

## 2014-01-11 ENCOUNTER — Encounter: Payer: Self-pay | Admitting: Family Medicine

## 2014-01-11 VITALS — BP 142/90 | HR 95 | Temp 98.6°F | Wt 186.0 lb

## 2014-01-11 DIAGNOSIS — E785 Hyperlipidemia, unspecified: Secondary | ICD-10-CM

## 2014-01-11 DIAGNOSIS — I10 Essential (primary) hypertension: Secondary | ICD-10-CM

## 2014-01-11 DIAGNOSIS — Z8639 Personal history of other endocrine, nutritional and metabolic disease: Secondary | ICD-10-CM

## 2014-01-11 DIAGNOSIS — E119 Type 2 diabetes mellitus without complications: Secondary | ICD-10-CM

## 2014-01-11 DIAGNOSIS — D139 Benign neoplasm of ill-defined sites within the digestive system: Secondary | ICD-10-CM

## 2014-01-11 LAB — CBC
HEMATOCRIT: 33.5 % — AB (ref 36.0–46.0)
HEMOGLOBIN: 11.1 g/dL — AB (ref 12.0–15.0)
MCH: 25.6 pg — ABNORMAL LOW (ref 26.0–34.0)
MCHC: 33.1 g/dL (ref 30.0–36.0)
MCV: 77.4 fL — ABNORMAL LOW (ref 78.0–100.0)
Platelets: 247 10*3/uL (ref 150–400)
RBC: 4.33 MIL/uL (ref 3.87–5.11)
RDW: 16.9 % — ABNORMAL HIGH (ref 11.5–15.5)
WBC: 6.5 10*3/uL (ref 4.0–10.5)

## 2014-01-11 LAB — LIPID PANEL
CHOLESTEROL: 129 mg/dL (ref 0–200)
HDL: 65 mg/dL (ref >39)
LDL CALC: 41 mg/dL (ref 0–99)
TRIGLYCERIDES: 116 mg/dL (ref <150)
Total CHOL/HDL Ratio: 2 Ratio
VLDL: 23 mg/dL (ref 0–40)

## 2014-01-11 LAB — COMPREHENSIVE METABOLIC PANEL
ALBUMIN: 4.2 g/dL (ref 3.5–5.2)
ALT: 18 U/L (ref 0–35)
AST: 12 U/L (ref 0–37)
Alkaline Phosphatase: 74 U/L (ref 39–117)
BUN: 10 mg/dL (ref 6–23)
CALCIUM: 9.6 mg/dL (ref 8.4–10.5)
CHLORIDE: 102 meq/L (ref 96–112)
CO2: 28 meq/L (ref 19–32)
Creat: 0.75 mg/dL (ref 0.50–1.10)
GLUCOSE: 123 mg/dL — AB (ref 70–99)
POTASSIUM: 3.8 meq/L (ref 3.5–5.3)
SODIUM: 140 meq/L (ref 135–145)
TOTAL PROTEIN: 7.1 g/dL (ref 6.0–8.3)
Total Bilirubin: 0.4 mg/dL (ref 0.2–1.2)

## 2014-01-11 LAB — POCT GLYCOSYLATED HEMOGLOBIN (HGB A1C): HEMOGLOBIN A1C: 6.8

## 2014-01-11 NOTE — Patient Instructions (Addendum)
Diabetes  A1c 6.8, continue metformin alone   Blood Pressure  Top number on recheck 142 and goal 140, bottom number of 90 and goal below 90  I think you are close enough to restart lifestyle changes and follow up in 3 months  For your labs, I will send you a letter if there are no medication changes needed. I will call you if we need to discuss your lab results.  Orders Placed This Encounter  Procedures  . CBC  . Comprehensive metabolic panel  . Lipid panel  . Vit D  25 hydroxy (rtn osteoporosis monitoring)   We will refer you to oncology (you are welcome to call on your own) Call gynecology for your pap Get your eye exam  See [place] in 3 months and I will miss you, Dr. Yong Channel

## 2014-01-11 NOTE — Assessment & Plan Note (Signed)
Of note, Referred again to oncology for follow up recommendations for imaging or lab work. Or if needs long term oncology care.

## 2014-01-11 NOTE — Progress Notes (Signed)
Garret Reddish, MD Phone: [phone]  Subjective:   Erica Campbell is a 58 y.o. year old very pleasant female patient who presents with the following:  Hypertension Hyperlipidemia BP Readings from Last 3 Encounters:  01/11/14 142/90  01/10/14 148/80  06/08/13 130/80  Home BP monitoring-had been checked at dentist and elevated so sent here Compliant with medications-yes without side effects, losartan-hctz at max dose and atenolol Also taking statin ROS-Denies any CP, HA, SOB, blurry vision, LE edema, transient weakness, orthopnea, PND. No myalgias.   DIABETES Type II Medications taking and tolerating-yes metformin 589m BID Blood Sugars per patient- checks in evening 2 hours after meals and typically aroudn 140 Diet-admits many poor choices Regular Exercise-has fallen off but is ready to restart   Health Maintenance Due  Topic Date Due  . Pap Smear  03/24/1973  . Ophthalmology Exam - patient just got letter and will call to schedule  07/23/2013  On Aspirin-yes On statin-yes Daily foot monitoring-yes  ROS- Denies Polyuria,Polydipsia, nocturia, Vision changes, feet or hand numbness/pain/tingling. Denies Hypoglycemia symptoms (shaky, sweaty, hungry, weak anxious, tremor, palpitations, confusion, behavior change).   Hemoglobin a1c:  Lab Results  Component Value Date   HGBA1C 6.8 01/11/2014   HGBA1C 6.5 06/08/2013   HGBA1C 9.7 11/22/2012   Low vitamin D Stopped taking OTC vitamin D. 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 albuterol (PROVENTIL HFA;VENTOLIN HFA) 108 (90 BASE) MCG/ACT inhaler Inhale 2 puffs into the lungs every 6 (six) hours as needed for wheezing.  1 Inhaler  3  . aspirin 81 MG tablet  Take 81 mg by mouth daily.      .Marland Kitchenatenolol (TENORMIN) 50 MG tablet Take one tablet by mouth one time daily  30 tablet  10  . fexofenadine (ALLEGRA) 60 MG tablet Take 1 tablet (60 mg total) by mouth daily.  30 tablet  5  . ibuprofen (ADVIL,MOTRIN) 200 MG tablet Take 800 mg by mouth every 6 (six) hours as needed for pain.      .Marland Kitchenlosartan-hydrochlorothiazide (HYZAAR) 100-25 MG per tablet Take one tablet by mouth one time daily  30 tablet  10  . lovastatin (MEVACOR) 20 MG tablet Take 2 tablets (40 mg total) by mouth at bedtime.  90 tablet  8  . metFORMIN (GLUCOPHAGE) 500 MG tablet Take 500 mg by mouth 2 (two) times daily with a meal.      . ACCU-CHEK SOFTCLIX LANCETS lancets Check each morning then as needed for low blood sugar. Can be changed to relion or accu-chek lancets per insurance needs.  100 each  12  . Blood Glucose Monitoring Suppl (BLOOD GLUCOSE METER) kit Check if symptoms low blood sugar. 1 meter-can be changed to relion or accu-chek meter per insurance needs.  1 each  0  . Carboxymethylcellulose Sodium (LUBRICANT EYE DROPS OP) Place 3 drops into both eyes as needed (dry eyes).      .Marland Kitchenglucose blood (ACCU-CHEK ACTIVE STRIPS) test strip Check each morning then as needed for low blood sugar. Can be changed to relion or accu-chek strips per insurance needs.  100 each  12   No current facility-administered medications for this visit.    Objective: BP 142/90  Pulse 95  Temp(Src) 98.6 F (37 C) (Oral)  Wt 186  lb (84.369 kg) Gen: NAD, resting comfortably in chair  CV: RRR no murmurs rubs or gallops (initial HR 101, 95 on my check, updated vitals), 2+ DP and PT pulses  Lungs: CTAB no crackles, wheeze, rhonchi Ext: no edema Skin: warm, dry, no rash  Neuro: Normal diabetic foot exam  Assessment/Plan:  HTN (hypertension) Mild poor control of both SBP and DBP. Discussed potential addition of medication but patient with recent weight gain and wants to try diet/exercise again. Advised dash  diet. Follow up in 3 months.   Hyperlipidemia LDL goal < 100 Check lipids today.   Littoral cell angioma Of note, Referred again to oncology for follow up recommendations for imaging or lab work. Or if needs long term oncology care.   H/O vitamin D deficiency Stopped taking taking vitamin D OTC. Check vitamin D.   Controlled type 2 diabetes mellitus without complication Well controlled. Continue current meds: metformin. Advised to see eye doctor.      Orders Placed This Encounter  Procedures  . CBC  . Comprehensive metabolic panel  . Lipid panel  . Vit D  25 hydroxy (rtn osteoporosis monitoring)  . Ambulatory referral to Oncology   Meds ordered this encounter  Medications  . metFORMIN (GLUCOPHAGE) 500 MG tablet    Sig: Take 500 mg by mouth 2 (two) times daily with a meal.

## 2014-01-11 NOTE — Assessment & Plan Note (Signed)
Check lipids today 

## 2014-01-11 NOTE — Assessment & Plan Note (Signed)
Stopped taking taking vitamin D OTC. Check vitamin D.

## 2014-01-11 NOTE — Assessment & Plan Note (Signed)
Well controlled. Continue current meds: metformin. Advised to see eye doctor.

## 2014-01-11 NOTE — Assessment & Plan Note (Signed)
Mild poor control of both SBP and DBP. Discussed potential addition of medication but patient with recent weight gain and wants to try diet/exercise again. Advised dash diet. Follow up in 3 months.

## 2014-01-12 LAB — VITAMIN D 25 HYDROXY (VIT D DEFICIENCY, FRACTURES): Vit D, 25-Hydroxy: 28 ng/mL — ABNORMAL LOW (ref 30–89)

## 2014-01-15 ENCOUNTER — Telehealth: Payer: Self-pay | Admitting: Oncology

## 2014-01-15 NOTE — Telephone Encounter (Signed)
LEFT MESSAGE FOR PATIENT TO RETURN CALL TO SCHEDULE NP APPT.  °

## 2014-01-17 ENCOUNTER — Telehealth: Payer: Self-pay | Admitting: Family Medicine

## 2014-01-17 ENCOUNTER — Telehealth: Payer: Self-pay | Admitting: Oncology

## 2014-01-17 DIAGNOSIS — D509 Iron deficiency anemia, unspecified: Secondary | ICD-10-CM | POA: Insufficient documentation

## 2014-01-17 DIAGNOSIS — Z8639 Personal history of other endocrine, nutritional and metabolic disease: Secondary | ICD-10-CM

## 2014-01-17 NOTE — Telephone Encounter (Signed)
Please call pt back to discuss lab results.

## 2014-01-17 NOTE — Telephone Encounter (Signed)
S/W PATIENT AND GAVE NP APPT FOR 06/19 @ 1:30 W/DR. SHADAD.  REFERRING DR. Annie Main HUNTER Bluebell

## 2014-01-17 NOTE — Assessment & Plan Note (Signed)
Level 28 on labs. Encouraged 800 units VIt D OTC daily.

## 2014-01-17 NOTE — Telephone Encounter (Signed)
Discussed results by phone. Needs colonoscopy given microcytic anemia and almost 10 years out (by 5 days). No other sites of bleeding (has had hysterectomy, no melena or BRBPR, no hematuria or hemoptysis). WIll place referral for GI at this time.

## 2014-01-17 NOTE — Telephone Encounter (Signed)
C/D 01/17/14 for appt. 01/19/14

## 2014-01-19 ENCOUNTER — Ambulatory Visit: Payer: BC Managed Care – PPO

## 2014-01-19 ENCOUNTER — Encounter: Payer: Self-pay | Admitting: Oncology

## 2014-01-19 ENCOUNTER — Telehealth: Payer: Self-pay | Admitting: Oncology

## 2014-01-19 ENCOUNTER — Other Ambulatory Visit: Payer: BC Managed Care – PPO

## 2014-01-19 ENCOUNTER — Ambulatory Visit (HOSPITAL_BASED_OUTPATIENT_CLINIC_OR_DEPARTMENT_OTHER): Payer: BC Managed Care – PPO

## 2014-01-19 ENCOUNTER — Ambulatory Visit (HOSPITAL_BASED_OUTPATIENT_CLINIC_OR_DEPARTMENT_OTHER): Payer: BC Managed Care – PPO | Admitting: Oncology

## 2014-01-19 VITALS — BP 133/84 | HR 99 | Temp 97.9°F | Resp 18 | Ht 63.0 in | Wt 187.3 lb

## 2014-01-19 DIAGNOSIS — D1399 Benign neoplasm of ill-defined sites within the digestive system: Secondary | ICD-10-CM

## 2014-01-19 DIAGNOSIS — Z853 Personal history of malignant neoplasm of breast: Secondary | ICD-10-CM

## 2014-01-19 DIAGNOSIS — D509 Iron deficiency anemia, unspecified: Secondary | ICD-10-CM

## 2014-01-19 DIAGNOSIS — O020 Blighted ovum and nonhydatidiform mole: Secondary | ICD-10-CM

## 2014-01-19 DIAGNOSIS — D139 Benign neoplasm of ill-defined sites within the digestive system: Secondary | ICD-10-CM

## 2014-01-19 LAB — CBC WITH DIFFERENTIAL/PLATELET
BASO%: 0.3 % (ref 0.0–2.0)
BASOS ABS: 0 10*3/uL (ref 0.0–0.1)
EOS ABS: 0.3 10*3/uL (ref 0.0–0.5)
EOS%: 3.4 % (ref 0.0–7.0)
HEMATOCRIT: 35.2 % (ref 34.8–46.6)
HEMOGLOBIN: 11.3 g/dL — AB (ref 11.6–15.9)
LYMPH%: 30.5 % (ref 14.0–49.7)
MCH: 25.4 pg (ref 25.1–34.0)
MCHC: 32.1 g/dL (ref 31.5–36.0)
MCV: 79.1 fL — AB (ref 79.5–101.0)
MONO#: 0.6 10*3/uL (ref 0.1–0.9)
MONO%: 7.5 % (ref 0.0–14.0)
NEUT%: 58.3 % (ref 38.4–76.8)
NEUTROS ABS: 4.4 10*3/uL (ref 1.5–6.5)
PLATELETS: 224 10*3/uL (ref 145–400)
RBC: 4.45 10*6/uL (ref 3.70–5.45)
RDW: 15.9 % — AB (ref 11.2–14.5)
WBC: 7.6 10*3/uL (ref 3.9–10.3)
lymph#: 2.3 10*3/uL (ref 0.9–3.3)

## 2014-01-19 LAB — COMPREHENSIVE METABOLIC PANEL (CC13)
ALBUMIN: 3.9 g/dL (ref 3.5–5.0)
ALK PHOS: 81 U/L (ref 40–150)
ALT: 17 U/L (ref 0–55)
AST: 13 U/L (ref 5–34)
Anion Gap: 10 mEq/L (ref 3–11)
BUN: 13.5 mg/dL (ref 7.0–26.0)
CALCIUM: 9.7 mg/dL (ref 8.4–10.4)
CO2: 29 mEq/L (ref 22–29)
CREATININE: 0.9 mg/dL (ref 0.6–1.1)
Chloride: 104 mEq/L (ref 98–109)
GLUCOSE: 148 mg/dL — AB (ref 70–140)
POTASSIUM: 3.4 meq/L — AB (ref 3.5–5.1)
Sodium: 143 mEq/L (ref 136–145)
Total Bilirubin: 0.3 mg/dL (ref 0.20–1.20)
Total Protein: 7.8 g/dL (ref 6.4–8.3)

## 2014-01-19 LAB — CHCC SMEAR

## 2014-01-19 NOTE — Consult Note (Signed)
Reason for Referral: Anemia and history of previous malignancies   HPI: This is a pleasant 58 year old woman currently of [place] after relocating from [place] in the last 3 years. She is a woman with a past medical history significant for hypertension, diabetes and remote history of breast cancer. Her history of breast cancer dates back to 1999 where she presented to the with a left breast tenderness and end up to have a tumor on the right breast. She underwent a mastectomy followed by adjuvant chemotherapy. I have no details beyond that at this point. She was on active surveillance for a number of years under the care of an oncologist in [place] and was discharged from their care without any evidence of disease. 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 She is vague about the date of this but this is probably close to 15 years ago. Most recently she was evaluated by her primary care physician and found to be mildly anemic with a hemoglobin of 11.1 and an MCV of 77.4. She is overdue for a colonoscopy which you have done over 10 years ago. Patient referred to me for the evaluation of these issues.  Clinically, she does have cancellation or GI symptoms that includes bloating, alternating constipating and diarrhea, but no hematochezia or melena. She does report some abdominal pain at times and mostly preprandial in nature. She have lost some weight that was intentional and now have gained some of it back. She does not report any fevers or chills or sweats. Does not report any chest pain shortness of breath or cough or hemoptysis. Does not report any hematuria or dysuria. She does not report any GYN bleeding. She has had a hysterectomy but still have her cervix. She does not report any arthralgias or myalgias. Does not report any lymphadenopathy or petechiae. She does not report any rashes or lesions. She  does not report any headaches or blurry vision or double vision. Does not report any seizure or syncope. She continues to work full-time reports a lot of stress and occasional fatigue. Rest of the review of systems unremarkable.   Past Medical History  Diagnosis Date  . Breast cancer     1999 s/p mastectomy right breast only.   . Allergic rhinitis   . Anemia     only in past, not current problem  . Asthma     since 1978  . Chronic pain     pelvic  . Diabetes mellitus     2010  . HTN (hypertension)     1993  . Kidney stone     2012  . H/O mastectomy   :  Past Surgical History  Procedure Laterality Date  . Cholecystectomy      2007-chronic cholecystitis with cholelithiasis  . Breast surgery    . Total abdominal hysterectomy      with cervix left only.   . Tubal ligation      1981  . Cesarean section      x3  . H/o mastectomy    :  Current Outpatient Prescriptions  Medication Sig Dispense Refill  . ACCU-CHEK SOFTCLIX LANCETS lancets Check each morning then as needed for low blood sugar. Can be changed to relion or accu-chek lancets per insurance needs.  100 each  12  . albuterol (PROVENTIL HFA;VENTOLIN HFA) 108 (90 BASE) MCG/ACT inhaler Inhale 2 puffs into the lungs every 6 (six)  hours as needed for wheezing.  1 Inhaler  3  . aspirin 81 MG tablet Take 81 mg by mouth daily.      Marland Kitchen atenolol (TENORMIN) 50 MG tablet Take one tablet by mouth one time daily  30 tablet  10  . Blood Glucose Monitoring Suppl (BLOOD GLUCOSE METER) kit Check if symptoms low blood sugar. 1 meter-can be changed to relion or accu-chek meter per insurance needs.  1 each  0  . Carboxymethylcellulose Sodium (LUBRICANT EYE DROPS OP) Place 3 drops into both eyes as needed (dry eyes).      . fexofenadine (ALLEGRA) 60 MG tablet Take 1 tablet (60 mg total) by mouth daily.  30 tablet  5  . glucose blood (ACCU-CHEK ACTIVE STRIPS) test strip Check each morning then as needed for low blood sugar. Can be changed to  relion or accu-chek strips per insurance needs.  100 each  12  . ibuprofen (ADVIL,MOTRIN) 200 MG tablet Take 800 mg by mouth every 6 (six) hours as needed for pain.      Marland Kitchen losartan-hydrochlorothiazide (HYZAAR) 100-25 MG per tablet Take one tablet by mouth one time daily  30 tablet  10  . lovastatin (MEVACOR) 20 MG tablet Take 2 tablets (40 mg total) by mouth at bedtime.  90 tablet  8  . metFORMIN (GLUCOPHAGE) 500 MG tablet Take 500 mg by mouth 2 (two) times daily with a meal.       No current facility-administered medications for this visit.       Allergies  Allergen Reactions  . Codeine Other (See Comments)    unknown  . Iodine Itching  :  Family History  Problem Relation Age of Onset  . Asthma Mother   . Endometrial cancer      paternal grandmother  . Colon cancer      paternal aunt  . Hypertension Mother   :  History   Social History  . Marital Status: Married    Spouse Name: N/A    Number of Children: N/A  . Years of Education: N/A   Occupational History  . Not on file.   Social History Main Topics  . Smoking status: Former Smoker -- 3 years    Quit date: 04/29/1975  . Smokeless tobacco: Not on file  . Alcohol Use: Yes     Comment: special occasions only  . Drug Use: No  . Sexual Activity: Yes    Partners: Male     Comment: with husband only   Other Topics Concern  . Not on file   Social History Narrative   What is important to patient:   Family extremely important, enjoys bringing everyone together.    Job is extremely important-always did work she enjoyed. Unemployed for 18 months. Now workign not face to face. Doesn't feel like she is helping people. Stressed out. Would like to have seonc nature job.    Community-very important. Church used to be a big part of community. Stress elevated pulled otu of element.    Faith very important-try to stay calm.       Goals for health-mental/spiritual health through praying/meditation. Physical health-move  more, eat better. Stay away from foods and other things that drag her down.       Works full time at [employer]. Has an associates degree. Married and lives with spouse. Sexually active with husband only. No religious beliefs affecting care.          Previous physicians:   Counselling psychologist.  Ginette Pitman at [facility] in [place] [phone]   [place] same #.   :    Exam: ECOG 0 Blood pressure 133/84, pulse 99, temperature 97.9 F (36.6 C), temperature source Oral, resp. rate 18, height 5' 3"  (1.6 m), weight 187 lb 4.8 oz (84.959 kg), SpO2 100.00%. General appearance: alert and cooperative Head: Normocephalic, without obvious abnormality Throat: lips, mucosa, and tongue normal; teeth and gums normal Neck: no adenopathy Back: symmetric, no curvature. ROM normal. No CVA tenderness. Resp: clear to auscultation bilaterally Chest wall: no tenderness Cardio: regular rate and rhythm, S1, S2 normal, no murmur, click, rub or gallop GI: soft, non-tender; bowel sounds normal; no masses,  no organomegaly Extremities: extremities normal, atraumatic, no cyanosis or edema Pulses: 2+ and symmetric Skin: Skin color, texture, turgor normal. No rashes or lesions Lymph nodes: Cervical, supraclavicular, and axillary nodes normal.   Assessment and Plan:   58 year old woman with the following issues:  1. Microcytic anemia that have been documented at least for the last 2 years. Her hemoglobin is 11.1 with an MCV of 77. Her hemoglobin was as low as 11.2 back in November of 2012. The differential diagnosis was discussed with the patient including deficiency anemia and possible hemoglobinopathy. She does have a family history of thalassemia which could be also contributing. Adding is very concerning that she is having GI symptoms with alteration of constipation and diarrhea associated with iron deficiency anemia and she will need a screening colonoscopy. I will check her iron stores  today as well as a thalassemia screen and I will refer her to gastroenterology for screening colonoscopy in any case. Her last colonoscopy was over 10 years ago.  2. History of breast cancer: No evidence of recurrence at this time she is up to date with her mammogram last of which was in October of 2014.  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 Does not require any further management or workup at this time. But if her symptoms of abdominal discomfort are not clearly documented by colonoscopy or endoscopy then a repeat imaging studies of the abdomen and pelvis would be reasonable at that time.  All her questions are answered today and we will arrange followup for it in 2 months to follow her progress.

## 2014-01-19 NOTE — Progress Notes (Signed)
Please see consult note.  

## 2014-01-19 NOTE — Telephone Encounter (Signed)
gv adn printed appt sched and avs for pt fro July and Aug..Marland KitchenMarland KitchenMarland Kitchenpt will send records to [place] from other facility....pt sched to see PA Anderson Malta [facility] on 7.17 @ 10am

## 2014-01-22 LAB — IRON AND TIBC CHCC
%SAT: 11 % — AB (ref 21–57)
IRON: 42 ug/dL (ref 41–142)
TIBC: 375 ug/dL (ref 236–444)
UIBC: 334 ug/dL (ref 120–384)

## 2014-01-22 LAB — FERRITIN CHCC: FERRITIN: 10 ng/mL (ref 9–269)

## 2014-01-23 LAB — HEMOGLOBINOPATHY EVALUATION
HEMOGLOBIN OTHER: 0 %
HGB S QUANTITAION: 0 %
Hgb A2 Quant: 2.6 % (ref 2.2–3.2)
Hgb A: 97.4 % (ref 96.8–97.8)
Hgb F Quant: 0 % (ref 0.0–2.0)

## 2014-01-26 ENCOUNTER — Other Ambulatory Visit: Payer: Self-pay | Admitting: Family Medicine

## 2014-02-06 ENCOUNTER — Encounter: Payer: Self-pay | Admitting: Oncology

## 2014-03-14 ENCOUNTER — Telehealth: Payer: Self-pay | Admitting: Medical Oncology

## 2014-03-14 NOTE — Telephone Encounter (Signed)
Ok to reschedule after her colonoscopy is done.

## 2014-03-14 NOTE — Telephone Encounter (Signed)
VMOM. Patient stating that she has appt with Dr Alen Blew this Friday that she believes was made "contingent on a colonoscopy result." Patient states she has not had the colonoscopy done nor has one scheduled yet. Also reports does not have her medical records from [place] either.  Patient requesting to know should she keep this appt 08/14 or reschedule pending her colonoscopy?  MD inboxed.

## 2014-03-14 NOTE — Telephone Encounter (Signed)
Return call to patient, spoke with husband and states patient at work, informed him per MD that pt ok to r/s appt after her colonoscopy is completed. He gave verbal understanding.  Patient's appt for Friday cancelled.

## 2014-03-16 ENCOUNTER — Ambulatory Visit: Payer: BC Managed Care – PPO | Admitting: Oncology

## 2014-03-16 ENCOUNTER — Other Ambulatory Visit: Payer: BC Managed Care – PPO

## 2014-05-23 ENCOUNTER — Encounter (HOSPITAL_COMMUNITY): Payer: Self-pay | Admitting: Emergency Medicine

## 2014-05-23 ENCOUNTER — Emergency Department (HOSPITAL_COMMUNITY)
Admission: EM | Admit: 2014-05-23 | Discharge: 2014-05-23 | Disposition: A | Discharge status: Home / Self Care | Payer: BC Managed Care – PPO | Source: Home / Self Care | Attending: Emergency Medicine | Admitting: Emergency Medicine

## 2014-05-23 DIAGNOSIS — R69 Illness, unspecified: Principal | ICD-10-CM

## 2014-05-23 DIAGNOSIS — J111 Influenza due to unidentified influenza virus with other respiratory manifestations: Secondary | ICD-10-CM

## 2014-05-23 LAB — POCT URINALYSIS DIP (DEVICE)
BILIRUBIN URINE: NEGATIVE
GLUCOSE, UA: NEGATIVE mg/dL
Hgb urine dipstick: NEGATIVE
Ketones, ur: NEGATIVE mg/dL
LEUKOCYTES UA: NEGATIVE
NITRITE: NEGATIVE
Protein, ur: NEGATIVE mg/dL
Specific Gravity, Urine: 1.025 (ref 1.005–1.030)
Urobilinogen, UA: 0.2 mg/dL (ref 0.0–1.0)
pH: 6 (ref 5.0–8.0)

## 2014-05-23 MED ORDER — BENZONATATE 100 MG PO CAPS: 100.0000 mg | ORAL_CAPSULE | Freq: Three times a day (TID) | ORAL | Status: DC | PRN

## 2014-05-23 MED ORDER — IPRATROPIUM BROMIDE 0.06 % NA SOLN: 2.0000 | Freq: Four times a day (QID) | NASAL | Status: DC

## 2014-05-23 NOTE — ED Provider Notes (Signed)
CSN: [id number]     Arrival date & time 05/23/14  1112 History   First MD Initiated Contact with Patient 05/23/14 1159     Chief Complaint  Patient presents with  . Cough   (Consider location/radiation/quality/duration/timing/severity/associated sxs/prior Treatment) HPI Comments: Nonsmoker PCP: [facility] No fever Works at [employer] Also wishes to mention 2-3 days of lower back discomfort without fever, dysuria, frequency, abdominal pain, N/V/D/C, vaginal bleeding or discharge.  Patient is a 58 y.o. female presenting with URI. The history is provided by the patient.  URI Presenting symptoms: congestion, cough, ear pain, fatigue and rhinorrhea   Presenting symptoms: no facial pain, no fever and no sore throat   Severity:  Moderate Onset quality:  Gradual Duration:  5 days Progression:  Unchanged Chronicity:  New Associated symptoms: myalgias     Past Medical History  Diagnosis Date  . Breast cancer     1999 s/p mastectomy right breast only.   . Allergic rhinitis   . Anemia     only in past, not current problem  . Asthma     since 1978  . Chronic pain     pelvic  . Diabetes mellitus     2010  . HTN (hypertension)     1993  . Kidney stone     2012  . H/O mastectomy    Past Surgical History  Procedure Laterality Date  . Cholecystectomy      2007-chronic cholecystitis with cholelithiasis  . Breast surgery    . Total abdominal hysterectomy      with cervix left only.   . Tubal ligation      1981  . Cesarean section      x3  . H/o mastectomy     Family History  Problem Relation Age of Onset  . Asthma Mother   . Endometrial cancer      paternal grandmother  . Colon cancer      paternal aunt  . Hypertension Mother    History  Substance Use Topics  . Smoking status: Former Smoker -- 3 years    Quit date: 04/29/1975  . Smokeless tobacco: Not on file  . Alcohol Use: Yes     Comment: special occasions only   OB History   Grav Para Term Preterm Abortions TAB SAB  Ect Mult Living                 Review of Systems  Constitutional: Positive for fatigue. Negative for fever.  HENT: Positive for congestion, ear pain and rhinorrhea. Negative for sore throat.   Respiratory: Positive for cough.   Musculoskeletal: Positive for myalgias.  All other systems reviewed and are negative.   Allergies  Codeine and Iodine  Home Medications   Prior to Admission medications   Medication Sig Start Date End Date Taking? Authorizing Provider  ACCU-CHEK SOFTCLIX LANCETS lancets Check each morning then as needed for low blood sugar. Can be changed to relion or accu-chek lancets per insurance needs. 11/23/12   Marin Olp, MD  albuterol (PROVENTIL HFA;VENTOLIN HFA) 108 (90 BASE) MCG/ACT inhaler Inhale 2 puffs into the lungs every 6 (six) hours as needed for wheezing. 11/22/12   Marin Olp, MD  aspirin 81 MG tablet Take 81 mg by mouth daily.    Historical Provider, MD  atenolol (TENORMIN) 50 MG tablet Take one tablet by mouth one time daily 10/26/13   Marin Olp, MD  benzonatate (TESSALON) 100 MG capsule Take 1 capsule (100 mg total)  by mouth 3 (three) times daily as needed for cough. 05/23/14   Audelia Hives Orvis Stann, PA  Blood Glucose Monitoring Suppl (BLOOD GLUCOSE METER) kit Check if symptoms low blood sugar. 1 meter-can be changed to relion or accu-chek meter per insurance needs. 11/23/12   Marin Olp, MD  Carboxymethylcellulose Sodium (LUBRICANT EYE DROPS OP) Place 3 drops into both eyes as needed (dry eyes).    Historical Provider, MD  fexofenadine (ALLEGRA) 60 MG tablet Take 1 tablet (60 mg total) by mouth daily. 11/22/12   Marin Olp, MD  glucose blood [facility] ACTIVE STRIPS) test strip Check each morning then as needed for low blood sugar. Can be changed to relion or accu-chek strips per insurance needs. 04/26/13   Marin Olp, MD  ibuprofen (ADVIL,MOTRIN) 200 MG tablet Take 800 mg by mouth every 6 (six) hours as needed for pain.     Historical Provider, MD  ipratropium (ATROVENT) 0.06 % nasal spray Place 2 sprays into both nostrils 4 (four) times daily. For nasal congestion 05/23/14   Lutricia Feil, PA  losartan-hydrochlorothiazide [facility]) 100-25 MG per tablet Take one tablet by mouth one time daily 11/21/13   Marin Olp, MD  lovastatin (MEVACOR) 20 MG tablet TAKE TWO TABLETS BY MOUTH NIGHTLY AT BEDTIME  01/26/14   Marin Olp, MD  metFORMIN (GLUCOPHAGE) 500 MG tablet Take 500 mg by mouth 2 (two) times daily with a meal.    Marin Olp, MD   BP 150/85  Pulse 98  Temp(Src) 98.5 F (36.9 C) (Oral)  Resp 16  SpO2 100% Physical Exam  Nursing note reviewed. Constitutional: She is oriented to person, place, and time. She appears well-developed and well-nourished. No distress.  HENT:  Head: Normocephalic and atraumatic.  Right Ear: Hearing, tympanic membrane, external ear and ear canal normal.  Left Ear: Hearing, tympanic membrane, external ear and ear canal normal.  Nose: Mucosal edema present.  Mouth/Throat: Uvula is midline, oropharynx is clear and moist and mucous membranes are normal.  Eyes: Conjunctivae are normal. No scleral icterus.  Neck: Normal range of motion. Neck supple.  Cardiovascular: Normal rate, regular rhythm and normal heart sounds.   Pulmonary/Chest: Effort normal and breath sounds normal. No respiratory distress. She has no wheezes.  Abdominal: Soft. Normal appearance and bowel sounds are normal. She exhibits no distension and no mass. There is no tenderness. There is no CVA tenderness.  Musculoskeletal: Normal range of motion.  Lymphadenopathy:    She has no cervical adenopathy.  Neurological: She is alert and oriented to person, place, and time.  Skin: Skin is warm and dry. No rash noted. No erythema.  Psychiatric: She has a normal mood and affect. Her behavior is normal.    ED Course  Procedures (including critical care time) Labs Review Labs Reviewed  POCT  URINALYSIS DIP (DEVICE)    Imaging Review No results found.   MDM   1. Influenza-like illness   UA negative Symptomatic care at home Fluids, rest, tylenol Tessalon Atrovent nasal spray Expect improvement over next 5-7 days   Lutricia Feil, PA 05/23/14 1329

## 2014-05-23 NOTE — ED Notes (Signed)
Pt  Reports  Symptoms   Of   Cough  / congestion   Body  Aches   Malaise   And    Headache  Symptoms   For  approx 5  Days

## 2014-05-23 NOTE — ED Provider Notes (Signed)
Medical screening examination/treatment/procedure(s) were performed by non-physician practitioner and as supervising physician I was immediately available for consultation/collaboration.  Philipp Deputy, M.D.  Harden Mo, MD 05/23/14 1420

## 2014-05-23 NOTE — Discharge Instructions (Signed)
Your urine studies were normal. Your exam suggests a viral upper respiratory, influenza-like illness. Please use tylenol as directed on packaging for aches and discomfort. Tessalon as prescribed for cough and Atrovent nasal spray for nasal congestion. It will take a few additional days for you to begin to feel better. Plenty of fluids and rest as your need to. Please follow up with your doctor if no improvement over the next few days.   Upper Respiratory Infection, Adult An upper respiratory infection (URI) is also sometimes known as the common cold. The upper respiratory tract includes the nose, sinuses, throat, trachea, and bronchi. Bronchi are the airways leading to the lungs. Most people improve within 1 week, but symptoms can last up to 2 weeks. A residual cough may last even longer.  CAUSES Many different viruses can infect the tissues lining the upper respiratory tract. The tissues become irritated and inflamed and often become very moist. Mucus production is also common. A cold is contagious. You can easily spread the virus to others by oral contact. This includes kissing, sharing a glass, coughing, or sneezing. Touching your mouth or nose and then touching a surface, which is then touched by another person, can also spread the virus. SYMPTOMS  Symptoms typically develop 1 to 3 days after you come in contact with a cold virus. Symptoms vary from person to person. They may include:  Runny nose.  Sneezing.  Nasal congestion.  Sinus irritation.  Sore throat.  Loss of voice (laryngitis).  Cough.  Fatigue.  Muscle aches.  Loss of appetite.  Headache.  Low-grade fever. DIAGNOSIS  You might diagnose your own cold based on familiar symptoms, since most people get a cold 2 to 3 times a year. Your caregiver can confirm this based on your exam. Most importantly, your caregiver can check that your symptoms are not due to another disease such as strep throat, sinusitis, pneumonia, asthma,  or epiglottitis. Blood tests, throat tests, and X-rays are not necessary to diagnose a common cold, but they may sometimes be helpful in excluding other more serious diseases. Your caregiver will decide if any further tests are required. RISKS AND COMPLICATIONS  You may be at risk for a more severe case of the common cold if you smoke cigarettes, have chronic heart disease (such as heart failure) or lung disease (such as asthma), or if you have a weakened immune system. The very young and very old are also at risk for more serious infections. Bacterial sinusitis, middle ear infections, and bacterial pneumonia can complicate the common cold. The common cold can worsen asthma and chronic obstructive pulmonary disease (COPD). Sometimes, these complications can require emergency medical care and may be life-threatening. PREVENTION  The best way to protect against getting a cold is to practice good hygiene. Avoid oral or hand contact with people with cold symptoms. Wash your hands often if contact occurs. There is no clear evidence that vitamin C, vitamin E, echinacea, or exercise reduces the chance of developing a cold. However, it is always recommended to get plenty of rest and practice good nutrition. TREATMENT  Treatment is directed at relieving symptoms. There is no cure. Antibiotics are not effective, because the infection is caused by a virus, not by bacteria. Treatment may include:  Increased fluid intake. Sports drinks offer valuable electrolytes, sugars, and fluids.  Breathing heated mist or steam (vaporizer or shower).  Eating chicken soup or other clear broths, and maintaining good nutrition.  Getting plenty of rest.  Using gargles or  lozenges for comfort.  Controlling fevers with ibuprofen or acetaminophen as directed by your caregiver.  Increasing usage of your inhaler if you have asthma. Zinc gel and zinc lozenges, taken in the first 24 hours of the common cold, can shorten the  duration and lessen the severity of symptoms. Pain medicines may help with fever, muscle aches, and throat pain. A variety of non-prescription medicines are available to treat congestion and runny nose. Your caregiver can make recommendations and may suggest nasal or lung inhalers for other symptoms.  HOME CARE INSTRUCTIONS   Only take over-the-counter or prescription medicines for pain, discomfort, or fever as directed by your caregiver.  Use a warm mist humidifier or inhale steam from a shower to increase air moisture. This may keep secretions moist and make it easier to breathe.  Drink enough water and fluids to keep your urine clear or pale yellow.  Rest as needed.  Return to work when your temperature has returned to normal or as your caregiver advises. You may need to stay home longer to avoid infecting others. You can also use a face mask and careful hand washing to prevent spread of the virus. SEEK MEDICAL CARE IF:   After the first few days, you feel you are getting worse rather than better.  You need your caregiver's advice about medicines to control symptoms.  You develop chills, worsening shortness of breath, or brown or red sputum. These may be signs of pneumonia.  You develop yellow or brown nasal discharge or pain in the face, especially when you bend forward. These may be signs of sinusitis.  You develop a fever, swollen neck glands, pain with swallowing, or white areas in the back of your throat. These may be signs of strep throat. SEEK IMMEDIATE MEDICAL CARE IF:   You have a fever.  You develop severe or persistent headache, ear pain, sinus pain, or chest pain.  You develop wheezing, a prolonged cough, cough up blood, or have a change in your usual mucus (if you have chronic lung disease).  You develop sore muscles or a stiff neck. Document Released: 01/13/2001 Document Revised: 10/12/2011 Document Reviewed: 10/25/2013 [facility] Patient Information 2015 [place],  [place]. This information is not intended to replace advice given to you by your health care provider. Make sure you discuss any questions you have with your health care provider.

## 2014-07-04 ENCOUNTER — Encounter: Payer: Self-pay | Admitting: Family Medicine

## 2014-07-04 ENCOUNTER — Ambulatory Visit (INDEPENDENT_AMBULATORY_CARE_PROVIDER_SITE_OTHER): Payer: BC Managed Care – PPO | Admitting: Family Medicine

## 2014-07-04 VITALS — BP 130/88 | HR 76 | Ht 63.0 in | Wt 194.0 lb

## 2014-07-04 DIAGNOSIS — I10 Essential (primary) hypertension: Secondary | ICD-10-CM | POA: Diagnosis not present

## 2014-07-04 DIAGNOSIS — J309 Allergic rhinitis, unspecified: Secondary | ICD-10-CM | POA: Diagnosis not present

## 2014-07-04 DIAGNOSIS — E119 Type 2 diabetes mellitus without complications: Secondary | ICD-10-CM

## 2014-07-04 DIAGNOSIS — J4520 Mild intermittent asthma, uncomplicated: Secondary | ICD-10-CM | POA: Diagnosis not present

## 2014-07-04 LAB — POCT GLYCOSYLATED HEMOGLOBIN (HGB A1C): HEMOGLOBIN A1C: 7.3

## 2014-07-04 MED ORDER — ALBUTEROL SULFATE HFA 108 (90 BASE) MCG/ACT IN AERS: 2.0000 | INHALATION_SPRAY | Freq: Four times a day (QID) | RESPIRATORY_TRACT | Status: DC | PRN

## 2014-07-04 MED ORDER — IPRATROPIUM BROMIDE 0.06 % NA SOLN: 2.0000 | Freq: Four times a day (QID) | NASAL | Status: DC

## 2014-07-04 MED ORDER — ATENOLOL 50 MG PO TABS: 50.0000 mg | ORAL_TABLET | Freq: Every day | ORAL | Status: DC

## 2014-07-04 MED ORDER — CARBOXYMETHYLCELLULOSE SODIUM 0.5 % OP SOLN: 1.0000 [drp] | OPHTHALMIC | Status: DC | PRN

## 2014-07-04 MED ORDER — METFORMIN HCL 500 MG PO TABS: 500.0000 mg | ORAL_TABLET | Freq: Two times a day (BID) | ORAL | Status: DC

## 2014-07-04 NOTE — Patient Instructions (Signed)
It was great seeing you today.   1. Pap smear: schedule this at your earliest convenience, if you want to wait until March 2016 that would be okay 2. Your a1c today was 7.3 (last one in June was 6.8).  3. Refills were sent for your medications    If we did any lab work today, if it is normal you can expect a letter in the mail.  Please bring all your medications to every doctors visit  Sign up for My Chart to have easy access to your labs results, and communication with your Primary care physician.    Please check-out at the front desk before leaving the clinic.   I look forward to talking with you again at our next visit. If you have any questions or concerns before then, please call the clinic at [phone].  Take Care,   Dr. Tawanna Sat

## 2014-07-04 NOTE — Progress Notes (Signed)
Due to patient not feeling well today she will wait on a flu shot till she feels better. Erica Campbell, [name]

## 2014-07-04 NOTE — Progress Notes (Signed)
   Subjective:    Patient ID: Erica Campbell, female    DOB: 01-29-1956, 58 y.o.   MRN: [id number]  HPI  CC: follow up, meet new doctor  # Diabetes:  Knows she "has been bad" over the past month, expecting higher lab test  Does not bring in meter today. Generally checks 2-3 times a day.  CBG recall: this morning 120, last night 180  No symptomatic low blood sugars  Requests med refills ROS: no abdominal pain, no dysuria, no increased urinary frequency  # Hypertension  No concerns with side effects of medications, taking everything as prescribed  She was surprised today about her low HR (says normally 100), did not realize atenolol was a beta blocker that slows HR down  Requests med refills ROS: no HA, no CP, no SOB, no changes in vision.  # Asthma/URI:   Has been using her albuterol about once per day, which is significant increase compared to her usual (says she didn't even know where it was for most of the last year)  Feels chest "tightness", not so much wheezing  Has had nasal congestion, some cough.  Requests med refills ROS: no fevers/chills, +SOB, no CP, no n/v, no diarrhea/constipation.   Review of Systems   See HPI for ROS. All other systems reviewed and are negative.  Past medical history, surgical, family, and social history reviewed and updated in the EMR as appropriate. Objective:  BP 130/88 mmHg  Pulse 76  Ht 5\' 3"  (1.6 m)  Wt 194 lb (87.998 kg)  BMI 34.37 kg/m2 Vitals reviewed  General: NAD HEENT: small amount clear rhinorrhea present CV: RRR, normal s1 and s2, no mrg. 2+ radial and PT pulses bilaterally Resp: CTAB, very scant end expiratory wheeze, normal WOB.  Ext: no edema or cyanosis, WWP.  Assessment & Plan:  See Problem List Documentation

## 2014-07-05 ENCOUNTER — Encounter: Payer: Self-pay | Admitting: Family Medicine

## 2014-07-05 NOTE — Assessment & Plan Note (Addendum)
Well controlled, A1c 7.3 (up from last visit). Continue current regimen. Discussed diet/exercise and to remain vigilant during holiday season. Still had not seen eye doctor for yearly exam, encouraged to get this done. F/u 3 months.

## 2014-07-05 NOTE — Assessment & Plan Note (Signed)
Mild-intermittent. Suspect worse lately in setting of URI and/or viral bronchitis. Not requiring additional therapies at this time, normal lung exam except for very faint end expiratory wheeze. Refills for albuterol sent. F/u as needed.

## 2014-07-05 NOTE — Assessment & Plan Note (Signed)
At goal today. Continue current management. F/u 3 months.

## 2014-07-23 ENCOUNTER — Ambulatory Visit: Payer: BC Managed Care – PPO

## 2014-07-23 ENCOUNTER — Ambulatory Visit: Payer: BC Managed Care – PPO | Admitting: *Deleted

## 2014-07-23 ENCOUNTER — Ambulatory Visit (INDEPENDENT_AMBULATORY_CARE_PROVIDER_SITE_OTHER): Payer: BC Managed Care – PPO | Admitting: *Deleted

## 2014-07-23 DIAGNOSIS — Z23 Encounter for immunization: Secondary | ICD-10-CM | POA: Diagnosis not present

## 2014-08-30 DIAGNOSIS — R42 Dizziness and giddiness: Secondary | ICD-10-CM

## 2014-08-30 DIAGNOSIS — J45909 Unspecified asthma, uncomplicated: Secondary | ICD-10-CM | POA: Insufficient documentation

## 2014-08-30 DIAGNOSIS — Z853 Personal history of malignant neoplasm of breast: Secondary | ICD-10-CM | POA: Insufficient documentation

## 2014-08-30 HISTORY — DX: Dizziness and giddiness: R42

## 2014-09-01 ENCOUNTER — Emergency Department (HOSPITAL_COMMUNITY)
Admission: EM | Admit: 2014-09-01 | Discharge: 2014-09-01 | Disposition: A | Discharge status: Home / Self Care | Payer: BC Managed Care – PPO | Attending: Emergency Medicine | Admitting: Emergency Medicine

## 2014-09-01 ENCOUNTER — Emergency Department (HOSPITAL_COMMUNITY): Payer: BC Managed Care – PPO

## 2014-09-01 ENCOUNTER — Encounter (HOSPITAL_COMMUNITY): Payer: Self-pay | Admitting: Cardiology

## 2014-09-01 DIAGNOSIS — H8110 Benign paroxysmal vertigo, unspecified ear: Secondary | ICD-10-CM | POA: Insufficient documentation

## 2014-09-01 DIAGNOSIS — Z79899 Other long term (current) drug therapy: Secondary | ICD-10-CM | POA: Insufficient documentation

## 2014-09-01 DIAGNOSIS — R42 Dizziness and giddiness: Secondary | ICD-10-CM | POA: Diagnosis present

## 2014-09-01 DIAGNOSIS — G8929 Other chronic pain: Secondary | ICD-10-CM | POA: Diagnosis not present

## 2014-09-01 DIAGNOSIS — I10 Essential (primary) hypertension: Secondary | ICD-10-CM | POA: Insufficient documentation

## 2014-09-01 DIAGNOSIS — Z87891 Personal history of nicotine dependence: Secondary | ICD-10-CM | POA: Insufficient documentation

## 2014-09-01 DIAGNOSIS — Z7982 Long term (current) use of aspirin: Secondary | ICD-10-CM | POA: Insufficient documentation

## 2014-09-01 DIAGNOSIS — Z862 Personal history of diseases of the blood and blood-forming organs and certain disorders involving the immune mechanism: Secondary | ICD-10-CM | POA: Diagnosis not present

## 2014-09-01 DIAGNOSIS — E119 Type 2 diabetes mellitus without complications: Secondary | ICD-10-CM | POA: Insufficient documentation

## 2014-09-01 DIAGNOSIS — Z7951 Long term (current) use of inhaled steroids: Secondary | ICD-10-CM | POA: Diagnosis not present

## 2014-09-01 DIAGNOSIS — Z853 Personal history of malignant neoplasm of breast: Secondary | ICD-10-CM | POA: Insufficient documentation

## 2014-09-01 DIAGNOSIS — Z87442 Personal history of urinary calculi: Secondary | ICD-10-CM | POA: Diagnosis not present

## 2014-09-01 DIAGNOSIS — J45909 Unspecified asthma, uncomplicated: Secondary | ICD-10-CM | POA: Diagnosis not present

## 2014-09-01 DIAGNOSIS — H8113 Benign paroxysmal vertigo, bilateral: Secondary | ICD-10-CM

## 2014-09-01 LAB — BASIC METABOLIC PANEL
Anion gap: 9 (ref 5–15)
BUN: 13 mg/dL (ref 6–23)
CALCIUM: 10.4 mg/dL (ref 8.4–10.5)
CO2: 29 mmol/L (ref 19–32)
CREATININE: 0.8 mg/dL (ref 0.50–1.10)
Chloride: 102 mmol/L (ref 96–112)
GFR calc Af Amer: >90 mL/min (ref >90)
GFR calc non Af Amer: 79 mL/min — ABNORMAL LOW (ref >90)
Glucose, Bld: 201 mg/dL — ABNORMAL HIGH (ref 70–99)
Potassium: 3.7 mmol/L (ref 3.5–5.1)
SODIUM: 140 mmol/L (ref 135–145)

## 2014-09-01 LAB — HEPATIC FUNCTION PANEL
ALT: 21 U/L (ref 0–35)
AST: 22 U/L (ref 0–37)
Albumin: 4 g/dL (ref 3.5–5.2)
Alkaline Phosphatase: 86 U/L (ref 39–117)
BILIRUBIN DIRECT: 0.1 mg/dL (ref 0.0–0.5)
BILIRUBIN TOTAL: 0.4 mg/dL (ref 0.3–1.2)
Indirect Bilirubin: 0.3 mg/dL (ref 0.3–0.9)
Total Protein: 8.3 g/dL (ref 6.0–8.3)

## 2014-09-01 LAB — TROPONIN I: Troponin I: <0.03 ng/mL (ref <0.031)

## 2014-09-01 LAB — CBC
HCT: 38.4 % (ref 36.0–46.0)
HEMOGLOBIN: 12.7 g/dL (ref 12.0–15.0)
MCH: 26.8 pg (ref 26.0–34.0)
MCHC: 33.1 g/dL (ref 30.0–36.0)
MCV: 81.2 fL (ref 78.0–100.0)
PLATELETS: 223 10*3/uL (ref 150–400)
RBC: 4.73 MIL/uL (ref 3.87–5.11)
RDW: 14.6 % (ref 11.5–15.5)
WBC: 7.2 10*3/uL (ref 4.0–10.5)

## 2014-09-01 LAB — CBG MONITORING, ED: GLUCOSE-CAPILLARY: 124 mg/dL — AB (ref 70–99)

## 2014-09-01 MED ORDER — LORAZEPAM 2 MG/ML IJ SOLN: 1.0000 mg | Freq: Once | INTRAMUSCULAR | Status: DC

## 2014-09-01 MED ORDER — LORAZEPAM 2 MG/ML IJ SOLN
2.0000 mg | Freq: Once | INTRAMUSCULAR | Status: AC
  Administered 2014-09-01: 2 mg via INTRAVENOUS

## 2014-09-01 MED ORDER — LORAZEPAM 2 MG/ML IJ SOLN
INTRAMUSCULAR | Status: AC
  Administered 2014-09-01: 2 mg via INTRAVENOUS
  Filled 2014-09-01: qty 1

## 2014-09-01 MED ORDER — LORAZEPAM 1 MG PO TABS
1.0000 mg | ORAL_TABLET | Freq: Once | ORAL | Status: AC
  Administered 2014-09-01: 1 mg via ORAL
  Filled 2014-09-01: qty 1

## 2014-09-01 MED ORDER — SODIUM CHLORIDE 0.9 % IV BOLUS (SEPSIS)
500.0000 mL | Freq: Once | INTRAVENOUS | Status: AC
  Administered 2014-09-01: 500 mL via INTRAVENOUS

## 2014-09-01 MED ORDER — DIAZEPAM 5 MG PO TABS: 5.0000 mg | ORAL_TABLET | Freq: Four times a day (QID) | ORAL | Status: DC | PRN

## 2014-09-01 NOTE — ED Notes (Signed)
Pt states that she felt a pressure/tightness in her chest when she had the ct scan done. Pt states that she thinks it might have been anxiety but she is not sure.

## 2014-09-01 NOTE — ED Notes (Signed)
Pt ambulated on the hallway without problems, only states dizziness when first standing up, some unsteady gait noticed when fist started to walk.

## 2014-09-01 NOTE — ED Notes (Signed)
Pt reports she has had a feeling of dizziness over the past couple of days and a weak/tingling sensation in the left arm over the past couple of days. No neuro deficits noted at triage. States she was seen at [facility] and dx with vertigo and given medication.

## 2014-09-01 NOTE — ED Provider Notes (Signed)
CSN: [id number]     Arrival date & time 09/01/14  1411 History   None    Chief Complaint  Patient presents with  . Dizziness  . Weakness     (Consider location/radiation/quality/duration/timing/severity/associated sxs/prior Treatment) Patient is a 59 y.o. female presenting with dizziness and weakness. The history is provided by the patient and medical records. No language interpreter was used.  Dizziness Associated symptoms: no chest pain, no diarrhea, no headaches, no nausea, no shortness of breath and no vomiting   Weakness Associated symptoms include fatigue and weakness. Pertinent negatives include no abdominal pain, chest pain, coughing, diaphoresis, fever, headaches, nausea, rash or vomiting.     Erica Campbell is a 59 y.o. female  with a hx of Breast Cancer (1999 right mastectomy, in remission), anemia, asthma, NIDDM, HTN, right mastectomy, cholecystectomy presents to the Emergency Department complaining of gradual, persistent, waxing and waning lightheadedness with associated right arm heaviness onset 3 days ago. Pt reports she was at work when she began to feel "bad."  She reports her left arm felt heavy, she felt "spaced-out," lightheaded and weak.  She reports she thought she should eat, but this did not make her feel better.  She reports she became very anxious and experienced palpitations for an hour or so which resolved and has not returned.  She did not have nausea or diaphoresis at that time.  She reports she never felt better, was seen at an Urgent care with a negative ECG and dx with vertigo.  She reports no dizziness or room spinning. She was given meclazine that makes her sleepy and helps her feel only marginally better.  She reports she did not take it today to see how she felt and has certainly felt worse.  She denies CP, SOB, abd pain, N/V/D, fever, chills, night sweats, weight loss, peripheral edema.  She reports a family Hx of Thalassemia, but has never been diagnosed  herself.  She reports she was followed by heme-onc for some time due to her anemia. Position change seems to make the lightheaded feeling worse.  She also reports significant fatigue over the last week or so, worse in the last 3 days.  Denies melena or hematochezia.   Past Medical History  Diagnosis Date  . Breast cancer     1999 s/p mastectomy right breast only.   . Allergic rhinitis   . Anemia     only in past, not current problem  . Asthma     since 1978  . Chronic pain     pelvic  . Diabetes mellitus     2010  . HTN (hypertension)     1993  . Kidney stone     2012  . H/O mastectomy    Past Surgical History  Procedure Laterality Date  . Cholecystectomy      2007-chronic cholecystitis with cholelithiasis  . Breast surgery    . Total abdominal hysterectomy      with cervix left only.   . Tubal ligation      1981  . Cesarean section      x3  . H/o mastectomy     Family History  Problem Relation Age of Onset  . Asthma Mother   . Endometrial cancer      paternal grandmother  . Colon cancer      paternal aunt  . Hypertension Mother    History  Substance Use Topics  . Smoking status: Former Smoker -- 3 years    Quit date:  04/29/1975  . Smokeless tobacco: Not on file  . Alcohol Use: Yes     Comment: special occasions only   OB History    No data available     Review of Systems  Constitutional: Positive for fatigue. Negative for fever, diaphoresis, appetite change and unexpected weight change.  HENT: Negative for mouth sores.   Eyes: Negative for visual disturbance.  Respiratory: Negative for cough, chest tightness, shortness of breath and wheezing.   Cardiovascular: Negative for chest pain.  Gastrointestinal: Negative for nausea, vomiting, abdominal pain, diarrhea and constipation.  Endocrine: Negative for polydipsia, polyphagia and polyuria.  Genitourinary: Negative for dysuria, urgency, frequency and hematuria.  Musculoskeletal: Negative for back pain and  neck stiffness.       Left arm heaviness  Skin: Negative for rash.  Allergic/Immunologic: Negative for immunocompromised state.  Neurological: Positive for weakness and light-headedness. Negative for syncope and headaches.  Hematological: Does not bruise/bleed easily.  Psychiatric/Behavioral: Negative for sleep disturbance. The patient is not nervous/anxious.       Allergies  Codeine and Iodine  Home Medications   Prior to Admission medications   Medication Sig Start Date End Date Taking? Authorizing Provider  ACCU-CHEK SOFTCLIX LANCETS lancets Check each morning then as needed for low blood sugar. Can be changed to relion or accu-chek lancets per insurance needs. 11/23/12   Marin Olp, MD  albuterol (PROVENTIL HFA;VENTOLIN HFA) 108 (90 BASE) MCG/ACT inhaler Inhale 2 puffs into the lungs every 6 (six) hours as needed for wheezing. 07/04/14   Leone Brand, MD  aspirin 81 MG tablet Take 81 mg by mouth daily.    Historical Provider, MD  atenolol (TENORMIN) 50 MG tablet Take 1 tablet (50 mg total) by mouth daily. 07/04/14   Leone Brand, MD  Blood Glucose Monitoring Suppl (BLOOD GLUCOSE METER) kit Check if symptoms low blood sugar. 1 meter-can be changed to relion or accu-chek meter per insurance needs. 11/23/12   Marin Olp, MD  carboxymethylcellulose (LUBRICANT EYE DROPS) 0.5 % SOLN Place 1 drop into both eyes as needed (dry eyes). 07/04/14   Leone Brand, MD  fexofenadine (ALLEGRA) 60 MG tablet Take 1 tablet (60 mg total) by mouth daily. 11/22/12   Marin Olp, MD  glucose blood [facility] ACTIVE STRIPS) test strip Check each morning then as needed for low blood sugar. Can be changed to relion or accu-chek strips per insurance needs. 04/26/13   Marin Olp, MD  ibuprofen (ADVIL,MOTRIN) 200 MG tablet Take 800 mg by mouth every 6 (six) hours as needed for pain.    Historical Provider, MD  ipratropium (ATROVENT) 0.06 % nasal spray Place 2 sprays into both nostrils 4 (four)  times daily. For nasal congestion 07/04/14   Leone Brand, MD  losartan-hydrochlorothiazide [facility]) 100-25 MG per tablet Take one tablet by mouth one time daily 11/21/13   Marin Olp, MD  lovastatin (MEVACOR) 20 MG tablet TAKE TWO TABLETS BY MOUTH NIGHTLY AT BEDTIME  01/26/14   Marin Olp, MD  metFORMIN (GLUCOPHAGE) 500 MG tablet Take 1 tablet (500 mg total) by mouth 2 (two) times daily with a meal. 07/04/14   Leone Brand, MD   BP 136/86 mmHg  Pulse 92  Temp(Src) 99.3 F (37.4 C) (Oral)  Resp 22  SpO2 99% Physical Exam  Constitutional: She is oriented to person, place, and time. She appears well-developed and well-nourished. No distress.  Awake, alert, nontoxic appearance  HENT:  Head: Normocephalic and atraumatic.  Right Ear: Tympanic membrane, external ear and ear canal normal.  Left Ear: Tympanic membrane, external ear and ear canal normal.  Nose: Nose normal. No epistaxis. Right sinus exhibits no maxillary sinus tenderness and no frontal sinus tenderness. Left sinus exhibits no maxillary sinus tenderness and no frontal sinus tenderness.  Mouth/Throat: Uvula is midline, oropharynx is clear and moist and mucous membranes are normal. Mucous membranes are not pale and not cyanotic. No oropharyngeal exudate, posterior oropharyngeal edema, posterior oropharyngeal erythema or tonsillar abscesses.  Eyes: Conjunctivae and EOM are normal. Pupils are equal, round, and reactive to light. No scleral icterus.  No horizontal, vertical or rotational nystagmus  Neck: Normal range of motion and full passive range of motion without pain. Neck supple.  Full active and passive ROM without pain No midline or paraspinal tenderness No nuchal rigidity or meningeal signs  Cardiovascular: Normal rate, regular rhythm, normal heart sounds and intact distal pulses.   No murmur heard. Pulmonary/Chest: Effort normal and breath sounds normal. No stridor. No respiratory distress. She has no wheezes. She  has no rales.  Clear and equal breath sounds without focal wheezes, rhonchi, rales  Abdominal: Soft. Bowel sounds are normal. She exhibits no distension and no mass. There is no tenderness. There is no rebound and no guarding.  Musculoskeletal: Normal range of motion. She exhibits no edema or tenderness.  No peripheral edema  Lymphadenopathy:    She has no cervical adenopathy.  Neurological: She is alert and oriented to person, place, and time. She has normal reflexes. No cranial nerve deficit. She exhibits normal muscle tone. Coordination normal.  Mental Status:  Alert, oriented, thought content appropriate. Speech fluent without evidence of aphasia. Able to follow 2 step commands without difficulty.  Cranial Nerves:  II:  Peripheral visual fields grossly normal, pupils equal, round, reactive to light III,IV, VI: ptosis not present, extra-ocular motions intact bilaterally  V,VII: smile symmetric, facial light touch sensation equal VIII: hearing grossly normal bilaterally  IX,X: gag reflex present  XI: bilateral shoulder shrug equal and strong XII: midline tongue extension  Motor:  5/5 in upper and lower extremities bilaterally including strong and equal grip strength and dorsiflexion/plantar flexion Sensory: Pinprick and light touch normal in all extremities.  Deep Tendon Reflexes: 2+ and symmetric  Cerebellar: normal finger-to-nose with bilateral upper extremities Gait: normal gait and balance CV: distal pulses palpable throughout   Skin: Skin is warm and dry. No rash noted. She is not diaphoretic. No erythema.  Psychiatric: She has a normal mood and affect. Her behavior is normal. Judgment and thought content normal.  Nursing note and vitals reviewed.   ED Course  Procedures (including critical care time) Labs Review Labs Reviewed  BASIC METABOLIC PANEL - Abnormal; Notable for the following:    Glucose, Bld 201 (*)    GFR calc non Af Amer 79 (*)    All other components within  normal limits  CBG MONITORING, ED - Abnormal; Notable for the following:    Glucose-Capillary 124 (*)    All other components within normal limits  CBC  TROPONIN I  HEPATIC FUNCTION PANEL    Imaging Review Ct Head Wo Contrast  09/01/2014   ADDENDUM REPORT: 09/01/2014 16:52  ADDENDUM: Incidental note is made of an 8 mm nodule in the thyroid.   Electronically Signed   By: Lowella Grip M.D.   On: 09/01/2014 16:52   09/01/2014   CLINICAL DATA:  Dizziness and altered mental status ; left arm tingling.  EXAM: CT  HEAD WITHOUT CONTRAST  CT CERVICAL SPINE WITHOUT CONTRAST  TECHNIQUE: Multidetector CT imaging of the head and cervical spine was performed following the standard protocol without intravenous contrast. Multiplanar CT image reconstructions of the cervical spine were also generated.  COMPARISON:  None.  FINDINGS: CT HEAD FINDINGS  The ventricles are normal in size and configuration. There is no mass, hemorrhage, extra-axial fluid collection, or midline shift. Gray-white compartments appear normal. No acute infarct apparent. The bony calvarium appears intact. The mastoid air cells are clear.  CT CERVICAL FINDINGS  There is no fracture or spondylolisthesis. Prevertebral soft tissues and predental space regions are normal. There is fairly severe disc space narrowing at C5-6 with anterior and posterior osteophytes along the inferior aspect of C5. Other disc spaces appear unremarkable.  At C2-3, there is slight facet hypertrophy bilaterally. There is no nerve root edema or effacement. No disc extrusion or stenosis.  At C3-4, there is no appreciable extradural defect. No nerve root edema or effacement. No disc extrusion or stenosis.  At C4-5, there is mild facet hypertrophy bilaterally. There is no nerve root edema or effacement. No disc extrusion or stenosis.  At C5-6, there is exit foraminal narrowing on the left due to bony hypertrophy with mild impression on the exiting nerve root at C5-6 due to bony  hypertrophy. No frank disc extrusion or stenosis.  At C6-7, there is minimal facet hypertrophy bilaterally. No nerve root edema or effacement. No disc extrusion or stenosis.  At C7-T1 and T1-2, there is no appreciable extradural defect on either side.  IMPRESSION: CT head: No intracranial mass, hemorrhage, or focal gray -white compartment lesions/acute appearing infarct.  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 No fracture or spondylolisthesis.  Electronically Signed: By: Lowella Grip M.D. On: 09/01/2014 16:49   Ct Cervical Spine Wo Contrast  09/01/2014   ADDENDUM REPORT: 09/01/2014 16:52  ADDENDUM: Incidental note is made of an 8 mm nodule in the thyroid.   Electronically Signed   By: Lowella Grip M.D.   On: 09/01/2014 16:52   09/01/2014   CLINICAL DATA:  Dizziness and altered mental status ; left arm tingling.  EXAM: CT HEAD WITHOUT CONTRAST  CT CERVICAL SPINE WITHOUT CONTRAST  TECHNIQUE: Multidetector CT imaging of the head and cervical spine was performed following the standard protocol without intravenous contrast. Multiplanar CT image reconstructions of the cervical spine were also generated.  COMPARISON:  None.  FINDINGS: CT HEAD FINDINGS  The ventricles are normal in size and configuration. There is no mass, hemorrhage, extra-axial fluid collection, or midline shift. Gray-white compartments appear normal. No acute infarct apparent. The bony calvarium appears intact. The mastoid air cells are clear.  CT CERVICAL FINDINGS  There is no fracture or spondylolisthesis. Prevertebral soft tissues and predental space regions are normal. There is fairly severe disc space narrowing at C5-6 with anterior and posterior osteophytes along the inferior aspect of C5. Other disc spaces appear unremarkable.  At C2-3, there is slight facet hypertrophy bilaterally. There is no nerve root edema or effacement. No disc extrusion  or stenosis.  At C3-4, there is no appreciable extradural defect. No nerve root edema or effacement. No disc extrusion or stenosis.  At C4-5, there is mild facet hypertrophy bilaterally. There is no nerve root edema or effacement. No disc extrusion or stenosis.  At C5-6, there is exit foraminal narrowing on the left due to bony hypertrophy with mild  impression on the exiting nerve root at C5-6 due to bony hypertrophy. No frank disc extrusion or stenosis.  At C6-7, there is minimal facet hypertrophy bilaterally. No nerve root edema or effacement. No disc extrusion or stenosis.  At C7-T1 and T1-2, there is no appreciable extradural defect on either side.  IMPRESSION: CT head: No intracranial mass, hemorrhage, or focal gray -white compartment lesions/acute appearing infarct.  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 No fracture or spondylolisthesis.  Electronically Signed: By: Lowella Grip M.D. On: 09/01/2014 16:49   Mr Brain Wo Contrast  09/01/2014   CLINICAL DATA:  Acute onset of weakness and dizziness. Tingling sensation in the left arm.  EXAM: MRI HEAD WITHOUT CONTRAST  TECHNIQUE: Multiplanar, multiecho pulse sequences of the brain and surrounding structures were obtained without intravenous contrast.  COMPARISON:  Head CT ear earlier same day  FINDINGS: The brain has a normal appearance on all pulse sequences without evidence of malformation, atrophy, old or acute infarction, mass lesion, hemorrhage, hydrocephalus or extra-axial collection. No pituitary mass. No fluid in the sinuses, middle ears or mastoids. No skull or skullbase lesion. There is flow in the major vessels at the base of the brain. Major venous sinuses show flow.  IMPRESSION: Normal examination.   Electronically Signed   By: Nelson Chimes M.D.   On: 09/01/2014 20:23     EKG Interpretation   Date/Time:  Saturday September 01 2014 14:17:01  EST Ventricular Rate:  107 PR Interval:  184 QRS Duration: 88 QT Interval:  344 QTC Calculation: 459 R Axis:   64 Text Interpretation:  Sinus tachycardia Possible Left atrial enlargement  Borderline ECG Confirmed by Hazle Coca [id number]) on 09/01/2014 2:25:14 PM      MDM   Final diagnoses:  BPPV (benign paroxysmal positional vertigo), bilateral  Lightheadedness   Erica Campbell presents with lightheadedness, fatigue, and left arm heaviness x 3 days without resolution or complete relief with meclazine.  Normal neurologic exam, without nystagmus, diplopia, vision changes or focal weakness.  Pt may have vertigo, but I am concerned for a more central process.  No evidence of otitis media on exam.  Pt with hx of BCA and without neck pain, but radiating discomfort to the left arm.  Will obtain CT head and neck.  Orthostatic vital signs, labs and imaging pending.  Pt reports feeling very anxious.  Will give ativan PO.    10:02 PM Pt work-up negative for electrolyte abnormalities, stroke, brain tumor, metastases to the spine or other central processes for her BPPV.  EKG nonischemic, troponin negative. Labs reassuring.  A sheet with some improvement in her dizziness here in the emergency room after Ativan administration. She is able to ambulate in the hall without difficulty or gait disturbance.  CT neck with osteoarthritic changes C5-C6 with exit foraminal narrowing on the left side due to bony hypertrophy and this is likely the reason for her left arm "heaviness". There is no evidence that she has experienced an acute coronary syndrome. She does not have chest pain or shortness of breath. She has no neurologic deficits.  Patient will be discharged home with Valium. She is to follow-up with her primary care provider and ear nose and throat on Monday.  She is to return to the emergency department for worsening symptoms, development of chest pain or syncope.  I have personally reviewed patient's vitals,  nursing note and any pertinent labs or  imaging.  I performed an undressed physical exam.    It has been determined that no acute conditions requiring further emergency intervention are present at this time. The patient/guardian have been advised of the diagnosis and plan. I reviewed all labs and imaging including any potential incidental findings. We have discussed signs and symptoms that warrant return to the ED and they are listed in the discharge instructions.    Vital signs are stable at discharge.   BP 136/86 mmHg  Pulse 92  Temp(Src) 99.3 F (37.4 C) (Oral)  Resp 22  SpO2 99%   The patient was discussed with Dr. Elise Benne who agrees with the treatment plan.      Jarrett Soho Tineka Uriegas, PA-C 09/01/14 [place], DO 09/03/14 1404

## 2014-09-01 NOTE — Discharge Instructions (Signed)
1. Medications: valium, usual home medications 2. Treatment: rest, drink plenty of fluids, 3. Follow Up: Please followup with your primary doctor in 3 days for discussion of your diagnoses and further evaluation after today's visit; if you do not have a primary care doctor use the resource guide provided to find one; Please return to the ER for worsening symptoms, syncope, chest pain or other concerns.  Please follow-up with ENT for further evaluation of your dizziness and discussion of physical therapy.    Benign Positional Vertigo Vertigo means you feel like you or your surroundings are moving when they are not. Benign positional vertigo is the most common form of vertigo. Benign means that the cause of your condition is not serious. Benign positional vertigo is more common in older adults. CAUSES  Benign positional vertigo is the result of an upset in the labyrinth system. This is an area in the middle ear that helps control your balance. This may be caused by a viral infection, head injury, or repetitive motion. However, often no specific cause is found. SYMPTOMS  Symptoms of benign positional vertigo occur when you move your head or eyes in different directions. Some of the symptoms may include:  Loss of balance and falls.  Vomiting.  Blurred vision.  Dizziness.  Nausea.  Involuntary eye movements (nystagmus). DIAGNOSIS  Benign positional vertigo is usually diagnosed by physical exam. If the specific cause of your benign positional vertigo is unknown, your caregiver may perform imaging tests, such as magnetic resonance imaging (MRI) or computed tomography (CT). TREATMENT  Your caregiver may recommend movements or procedures to correct the benign positional vertigo. Medicines such as meclizine, benzodiazepines, and medicines for nausea may be used to treat your symptoms. In rare cases, if your symptoms are caused by certain conditions that affect the inner ear, you may need  surgery. HOME CARE INSTRUCTIONS   Follow your caregiver's instructions.  Move slowly. Do not make sudden body or head movements.  Avoid driving.  Avoid operating heavy machinery.  Avoid performing any tasks that would be dangerous to you or others during a vertigo episode.  Drink enough fluids to keep your urine clear or pale yellow. SEEK IMMEDIATE MEDICAL CARE IF:   You develop problems with walking, weakness, numbness, or using your arms, hands, or legs.  You have difficulty speaking.  You develop severe headaches.  Your nausea or vomiting continues or gets worse.  You develop visual changes.  Your family or friends notice any behavioral changes.  Your condition gets worse.  You have a fever.  You develop a stiff neck or sensitivity to light. MAKE SURE YOU:   Understand these instructions.  Will watch your condition.  Will get help right away if you are not doing well or get worse. Document Released: 04/27/2006 Document Revised: 10/12/2011 Document Reviewed: 04/09/2011 [facility] Patient Information 2015 [place], [place]. This information is not intended to replace advice given to you by your health care provider. Make sure you discuss any questions you have with your health care provider.

## 2014-09-01 NOTE — ED Notes (Signed)
Spoke with MRI tech and pt made aware scan will be between 30 minutes-1 hr.

## 2014-09-01 NOTE — ED Notes (Signed)
CBG was 124.

## 2014-09-01 NOTE — ED Notes (Signed)
Patient transported to MRI 

## 2014-11-08 ENCOUNTER — Encounter: Payer: Self-pay | Admitting: Family Medicine

## 2014-11-08 ENCOUNTER — Ambulatory Visit (INDEPENDENT_AMBULATORY_CARE_PROVIDER_SITE_OTHER): Payer: BC Managed Care – PPO | Admitting: Family Medicine

## 2014-11-08 VITALS — BP 140/91 | HR 89 | Temp 98.5°F | Ht 63.0 in | Wt 189.0 lb

## 2014-11-08 DIAGNOSIS — R42 Dizziness and giddiness: Secondary | ICD-10-CM

## 2014-11-08 DIAGNOSIS — F410 Panic disorder [episodic paroxysmal anxiety] without agoraphobia: Secondary | ICD-10-CM | POA: Diagnosis not present

## 2014-11-08 MED ORDER — DIAZEPAM 5 MG PO TABS: 5.0000 mg | ORAL_TABLET | Freq: Four times a day (QID) | ORAL | Status: DC | PRN

## 2014-11-08 NOTE — Progress Notes (Signed)
  Subjective:     Erica Campbell is a 59 y.o. female who presents for evaluation of dizziness. The symptoms started 2 months ago and are unchanged. The attacks occur rarely and last 10 minutes. Positions that worsen symptoms: none. Previous workup/treatments: CT scan of head which was negative per her report. Associated ear symptoms: impending sense of doom, hyperventilation. Associated CNS symptoms: dizziness. Recent infections: none. Head trauma: denied. Drug ingestion: none. Noise exposure: no occupational exposure. Family history: non-contributory.  States she has 2 month hx, happened 2-3 x, given valium in past which she took 1/2 (2.5 mg) which helped with her Sx.  As well, previous hx of vertigo, sent to ENT but has not been seen at this time.  Denies N/V/blurred/diplopia or HA during episodes.  Concerning to her because she wants to make sure it's not something major.   The following portions of the patient's history were reviewed and updated as appropriate: allergies, current medications, past family history, past medical history, past social history, past surgical history and problem list.  Review of Systems Pertinent items are noted in HPI.    Objective:    General appearance: alert, cooperative and appears stated age Head: Normocephalic, without obvious abnormality, atraumatic Heart: normal apical impulse and regular rate and rhythm Neurologic: Alert and oriented X 3, normal strength and tone. Normal symmetric reflexes. Normal coordination and gait Coordination: Romberg test normal Gait: Normal      Assessment:    Panic Disorder, minor    Plan:    Benzodiazapine per medication orders. Referral to ENT.   Most likely panic in nature.  Recommend 5 mg valium at time to see if improved Sx.  No neurologic or history concerning for intracranial process, as she has previously had CT of head which was negative for this as well.  ENT referral for her vertigo and to r/o inner ear/vestibular  origin.

## 2014-11-08 NOTE — Patient Instructions (Signed)

## 2014-11-29 ENCOUNTER — Other Ambulatory Visit: Payer: Self-pay | Admitting: *Deleted

## 2014-11-30 MED ORDER — LOSARTAN POTASSIUM-HCTZ 100-25 MG PO TABS: 1.0000 | ORAL_TABLET | Freq: Every day | ORAL | Status: DC

## 2014-12-12 ENCOUNTER — Encounter: Payer: Self-pay | Admitting: Family Medicine

## 2014-12-12 DIAGNOSIS — R42 Dizziness and giddiness: Secondary | ICD-10-CM | POA: Insufficient documentation

## 2014-12-12 HISTORY — DX: Dizziness and giddiness: R42

## 2015-02-07 ENCOUNTER — Other Ambulatory Visit: Payer: Self-pay | Admitting: *Deleted

## 2015-02-07 MED ORDER — LOVASTATIN 20 MG PO TABS: ORAL_TABLET | ORAL | Status: DC

## 2015-02-08 ENCOUNTER — Ambulatory Visit (INDEPENDENT_AMBULATORY_CARE_PROVIDER_SITE_OTHER): Payer: BC Managed Care – PPO | Admitting: Family Medicine

## 2015-02-08 ENCOUNTER — Encounter: Payer: Self-pay | Admitting: Family Medicine

## 2015-02-08 VITALS — Wt 190.0 lb

## 2015-02-08 DIAGNOSIS — R42 Dizziness and giddiness: Secondary | ICD-10-CM | POA: Diagnosis not present

## 2015-02-08 DIAGNOSIS — E119 Type 2 diabetes mellitus without complications: Secondary | ICD-10-CM

## 2015-02-08 DIAGNOSIS — I10 Essential (primary) hypertension: Secondary | ICD-10-CM | POA: Diagnosis not present

## 2015-02-08 DIAGNOSIS — F4322 Adjustment disorder with anxiety: Secondary | ICD-10-CM | POA: Insufficient documentation

## 2015-02-08 HISTORY — DX: Adjustment disorder with anxiety: F43.22

## 2015-02-08 LAB — CBC WITH DIFFERENTIAL/PLATELET
Basophils Absolute: 0 10*3/uL (ref 0.0–0.1)
Basophils Relative: 0 % (ref 0–1)
Eosinophils Absolute: 0.2 10*3/uL (ref 0.0–0.7)
Eosinophils Relative: 3 % (ref 0–5)
HCT: 38.3 % (ref 36.0–46.0)
Hemoglobin: 12.4 g/dL (ref 12.0–15.0)
Lymphocytes Relative: 32 % (ref 12–46)
Lymphs Abs: 2.2 10*3/uL (ref 0.7–4.0)
MCH: 26.6 pg (ref 26.0–34.0)
MCHC: 32.4 g/dL (ref 30.0–36.0)
MCV: 82 fL (ref 78.0–100.0)
MPV: 9.2 fL (ref 8.6–12.4)
Monocytes Absolute: 0.5 10*3/uL (ref 0.1–1.0)
Monocytes Relative: 7 % (ref 3–12)
NEUTROS PCT: 58 % (ref 43–77)
Neutro Abs: 3.9 10*3/uL (ref 1.7–7.7)
Platelets: 255 10*3/uL (ref 150–400)
RBC: 4.67 MIL/uL (ref 3.87–5.11)
RDW: 15.2 % (ref 11.5–15.5)
WBC: 6.8 10*3/uL (ref 4.0–10.5)

## 2015-02-08 LAB — TSH: TSH: 0.594 u[IU]/mL (ref 0.350–4.500)

## 2015-02-08 LAB — POCT GLYCOSYLATED HEMOGLOBIN (HGB A1C): Hemoglobin A1C: 7.2

## 2015-02-08 MED ORDER — DIAZEPAM 5 MG PO TABS: 2.5000 mg | ORAL_TABLET | Freq: Four times a day (QID) | ORAL | Status: DC | PRN

## 2015-02-08 NOTE — Assessment & Plan Note (Signed)
Likely related to stress at work and home. GAD-7 score of 4. PHQ2 score of 0. Counseling done briefly. I discussed patient with Dr Gwenlyn Saran who spent time counseling her as well. Since she had done well on 2.5 mg Valium this was refilled for few more days. She is to follow up with Dr Gwenlyn Saran in 1 wk. Return precaution discussed.

## 2015-02-08 NOTE — Progress Notes (Signed)
Dr. Gwendlyn Deutscher asked for a behavioral health consult with the goal of further exploration of stressors and possible ways to manage them.  I met with the patient, along with Dr. Dallas Schimke, for about 30 minutes.  Patient mentioned several stressors including moving to [place] about four years ago away from her children and grandchildren to be close to her parents, her husband's health issues, watching her parents age, and her work environment.  She was able to name mitigating factors to all of the stressors above with the exception of the work environment.    With regards to this, she is in a two person department and had a challenging relationship with her supervisor.  This has been ongoing from the beginning but has reached a tipping point.  She has been thinking about other options but is unsure how to proceed.  She employs several good strategies for stress management already including diaphragmatic breathing, daily practice of gratitudes, and journaling.  She seems well supported in relationships with her husband, parents, children, and church.  Of note - she self-reports that she is the "strong one" and puts on a brave face both at home and at work.  She is tearful and concerned she is playing the "victim" in the room today.  Discussed options.  Developed the following plan:  1)  Follow-up with me in integrated care one week from today at 9:00.  Discussed that anxiety is often times the body's way of telling us something needs to change.  She has identified that something about her work (the work, or her reaction to it) needs to change.  I asked her to develop a list of questions / options for [place] to discuss next week.  She specifically mentioned financial considerations as one thing she would like to address . 2)  Discussed with Dr. Gwendlyn Deutscher a short course of an anxiolytic until next week.  Mrs. Culliton has used 2.5 mg of Valium previously with reasonable effect (she was prescribed 5 mg but elected to take half).   Discussed that this type of medicine is not without its drawbacks.  She voiced that she does not wish to be on anything long-term.  Did not assess substance use today.  Will follow-up next visit.

## 2015-02-08 NOTE — Progress Notes (Signed)
Subjective:     Patient ID: Erica Campbell, female   DOB: 10-10-1955, 59 y.o.   MRN: [id number]  HPI  Dizziness: Feeling better now. But she continues to worry and feel she is going to fall in front of people in church and at work. She is feeling nervous and anxious about that. Anxiety: She is worried her anxiety is coming back which is contributing to her dizziness. She was diagnosed with anxiety about 20 years ago but had been fine for many years till now. She is going through more stress, she is a cancer survivor, her son is a Engineer, structural which makes her worry as well. Denies feeling of hurting herself or someone else. Denies depression but she tears up easily, she stated she feels overwhelmed. 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 She does  Not feel her job is stressful but not happy with her work situation. DM2: Compliant with Metformin 500 mg BID. Denies any concern with her DM. HTN: Compliant with all her meds, denies any concern today.  Current Outpatient Prescriptions on File Prior to Visit  Medication Sig Dispense Refill  . ACCU-CHEK SOFTCLIX LANCETS lancets Check each morning then as needed for low blood sugar. Can be changed to relion or accu-chek lancets per insurance needs. 100 each 12  . albuterol (PROVENTIL HFA;VENTOLIN HFA) 108 (90 BASE) MCG/ACT inhaler Inhale 2 puffs into the lungs every 6 (six) hours as needed for wheezing. 1 Inhaler 11  . aspirin 81 MG tablet Take 81 mg by mouth daily.    Marland Kitchen atenolol (TENORMIN) 50 MG tablet Take 1 tablet (50 mg total) by mouth daily. 30 tablet 11  . Blood Glucose Monitoring Suppl (BLOOD GLUCOSE METER) kit Check if symptoms low blood sugar. 1 meter-can be changed to relion or accu-chek meter per insurance needs. 1 each 0  . carboxymethylcellulose (LUBRICANT EYE DROPS) 0.5 % SOLN Place 1 drop into both eyes as needed (dry eyes). 15 mL 5  . diazepam (VALIUM) 5 MG tablet Take 1 tablet (5 mg  total) by mouth every 6 (six) hours as needed (dizziness). 10 tablet 0  . fexofenadine (ALLEGRA) 60 MG tablet Take 1 tablet (60 mg total) by mouth daily. 30 tablet 5  . glucose blood (ACCU-CHEK ACTIVE STRIPS) test strip Check each morning then as needed for low blood sugar. Can be changed to relion or accu-chek strips per insurance needs. 100 each 12  . ibuprofen (ADVIL,MOTRIN) 200 MG tablet Take 800 mg by mouth every 6 (six) hours as needed for pain.    Marland Kitchen ipratropium (ATROVENT) 0.06 % nasal spray Place 2 sprays into both nostrils 4 (four) times daily. For nasal congestion 15 mL 11  . losartan-hydrochlorothiazide (HYZAAR) 100-25 MG per tablet Take 1 tablet by mouth daily. 90 tablet 3  . lovastatin (MEVACOR) 20 MG tablet TAKE TWO TABLETS BY MOUTH NIGHTLY AT BEDTIME 180 tablet 3  . metFORMIN (GLUCOPHAGE) 500 MG tablet Take 1 tablet (500 mg total) by mouth 2 (two) times daily with a meal. 60 tablet 5   No current facility-administered medications on file prior to visit.   Past Medical History  Diagnosis Date  . Breast cancer     1999 s/p mastectomy right breast only.   . Allergic rhinitis   . Anemia     only in past, not current problem  . Asthma     since 1978  . Chronic pain     pelvic  .  Diabetes mellitus     2010  . HTN (hypertension)     1993  . Kidney stone     2012  . H/O mastectomy       Review of Systems  Constitutional: Negative.   Respiratory: Negative.   Cardiovascular: Negative.   Gastrointestinal: Negative.   Neurological: Negative for dizziness and headaches.  Psychiatric/Behavioral: Negative for suicidal ideas, sleep disturbance and self-injury. The patient is nervous/anxious.   All other systems reviewed and are negative.      Objective:   Physical Exam  Constitutional: She is oriented to person, place, and time. She appears well-developed. No distress.  Cardiovascular: Normal rate, regular rhythm, normal heart sounds and intact distal pulses.   No murmur  heard. Pulmonary/Chest: Effort normal and breath sounds normal. No respiratory distress. She has no wheezes.  Abdominal: Soft. Bowel sounds are normal. She exhibits no distension and no mass. There is no tenderness.  Musculoskeletal: She exhibits no edema.  Neurological: She is alert and oriented to person, place, and time. She has normal strength. No cranial nerve deficit or sensory deficit. She displays a negative Romberg sign. Gait normal.  Psychiatric: Her speech is normal and behavior is normal. Judgment and thought content normal. Her mood appears anxious. Cognition and memory are normal. She expresses no homicidal and no suicidal ideation.  Teary intermittently  Nursing note and vitals reviewed.      Assessment:     Dizziness Anxiety DM2 HTN     Plan:     Check problem list.

## 2015-02-08 NOTE — Assessment & Plan Note (Signed)
BP slightly elevated today. Patient is compliant with meds. Her BP check in the last few visits had been fine. Will have her monitor BP at home for now on current regimen. F/U in about 2 wks with PCP for reassessment.

## 2015-02-08 NOTE — Patient Instructions (Signed)
It was nice seeing you today. I am happy you feel better with your dizziness, but concern about your mild anxiety. Please talk to Dr Gwenlyn Saran for counseling. I don't feel you need medication now. Please see your PCP soon.

## 2015-02-08 NOTE — Assessment & Plan Note (Signed)
Patient feeling much better. Orthostatic BP check normal today. MRI brain recently done was reviewed and it was normal. No neurologic deficit. Monitor for now since she is better.

## 2015-02-08 NOTE — Assessment & Plan Note (Signed)
Patient's A1C today is 7.2 from 7.3 7 months ago. She had done poorly with her diet she said. I recommended continuing current dose of Metformin. Improve exercise and diet. Recheck A1C in 3 months. F/U with PCP as needed.

## 2015-02-11 ENCOUNTER — Encounter: Payer: Self-pay | Admitting: *Deleted

## 2015-02-11 ENCOUNTER — Telehealth: Payer: Self-pay | Admitting: *Deleted

## 2015-02-11 NOTE — Telephone Encounter (Signed)
-----   Message from Kinnie Feil, MD sent at 02/11/2015  6:55 AM EDT ----- Please call to inform patient that her lab results are normal. Follow up with PCP as instructed.

## 2015-02-11 NOTE — Telephone Encounter (Signed)
Letter mailed to patient. Rilan Eiland,CMA  

## 2015-02-15 ENCOUNTER — Ambulatory Visit (INDEPENDENT_AMBULATORY_CARE_PROVIDER_SITE_OTHER): Payer: BC Managed Care – PPO | Admitting: Psychology

## 2015-02-15 ENCOUNTER — Other Ambulatory Visit: Payer: Self-pay | Admitting: Family Medicine

## 2015-02-15 DIAGNOSIS — F4322 Adjustment disorder with anxiety: Secondary | ICD-10-CM

## 2015-02-15 NOTE — Telephone Encounter (Signed)
[facility] on [address].  Patient is going out of town and would like to get this medication refilled before leaving.  Jazmin Hartsell,CMA

## 2015-02-15 NOTE — Progress Notes (Signed)
Erica Campbell presented for an Integrated Care follow-up.  We met for 30 minutes.  Dr. Dallas Schimke was present in the room.  See note dated one week ago for historical information.  Erica Campbell reported that she felt very good about our conversation last week - like a weight had been lifted.  She has been thinking more and more about her situation and wrote some things down to discuss today.  Some important points:  1) Financially, she could likely do okay without a job for [employer]; this feels scary nonetheless. 2) Leaving this position feels like she is running away from a person and this feels weak.  Explored first point a bit more and reframed second one.  When it is a bad match for physical and mental health, walking away can be the strongest thing to do.  This seemed to resonate with her.    Discussed next steps.  She volunteered that she will be exploring other options on-line and would like to get back to the gym.  Discussed ongoing contact and she asked for another follow-up.  Scheduled for 8/8 at 8:30.  This will be after a major training at work where the stress level might have been high.

## 2015-02-15 NOTE — Patient Instructions (Signed)
Follow up with Dr. Gwenlyn Saran on August 8th at 8:30 to further discuss the changes you are making. In the mean time, you are going to be looking on line at opportunities. You also thought heading back to the gym to do some walking would be a step in the right direction.  Three times a week for an hour each time.   Remember that getting yourself out of unhealthy situations is a sign of strength.

## 2015-02-15 NOTE — Assessment & Plan Note (Signed)
Patient mood and affect improved over last visit.  Continues to contemplate needed changes.  Ambivalence remains.  Will continue to use MI techniques to explore next best steps for her.  See patient instructions for further plan.

## 2015-03-11 ENCOUNTER — Ambulatory Visit: Payer: BC Managed Care – PPO | Admitting: Psychology

## 2015-03-11 ENCOUNTER — Telehealth: Payer: Self-pay | Admitting: Psychology

## 2015-03-11 NOTE — Telephone Encounter (Signed)
Called patient because she did not show for her follow-up. Her husband (I think) answered the phone.  He noted the [facility] number and said that he had tried to call earlier on her behalf to cancel the appointment but we were not yet open.  She had to go into work unexpectedly this morning.  He said that he would have her call me back.  Will await follow-up.

## 2015-03-26 ENCOUNTER — Telehealth: Payer: Self-pay | Admitting: Psychology

## 2015-03-26 NOTE — Telephone Encounter (Signed)
Called patient to check-in since she canceled her last follow-up.  I left a VM.  If I don't hear back, I will let patient initiate any future follow-ups if and when the need arises.

## 2015-04-01 ENCOUNTER — Ambulatory Visit (INDEPENDENT_AMBULATORY_CARE_PROVIDER_SITE_OTHER): Payer: BC Managed Care – PPO | Admitting: Family Medicine

## 2015-04-01 VITALS — BP 152/86 | HR 76 | Temp 99.4°F | Wt 190.2 lb

## 2015-04-01 DIAGNOSIS — R309 Painful micturition, unspecified: Secondary | ICD-10-CM

## 2015-04-01 DIAGNOSIS — L989 Disorder of the skin and subcutaneous tissue, unspecified: Secondary | ICD-10-CM

## 2015-04-01 DIAGNOSIS — R0781 Pleurodynia: Secondary | ICD-10-CM

## 2015-04-01 LAB — POCT URINALYSIS DIPSTICK
Bilirubin, UA: NEGATIVE
GLUCOSE UA: NEGATIVE
Ketones, UA: NEGATIVE
Leukocytes, UA: NEGATIVE
Nitrite, UA: NEGATIVE
PROTEIN UA: NEGATIVE
RBC UA: NEGATIVE
Spec Grav, UA: 1.015
UROBILINOGEN UA: 0.2
pH, UA: 7

## 2015-04-01 MED ORDER — NAPROXEN 500 MG PO TABS: 500.0000 mg | ORAL_TABLET | Freq: Two times a day (BID) | ORAL | Status: DC

## 2015-04-01 NOTE — Patient Instructions (Addendum)
Thank you for coming to see me today. It was a pleasure. Today we talked about:   Side pain: This is most likely related to the muscles/bones of your ribs. I am prescribing some pain medication for you to take for the next few days. If you develop trouble breathing, coughing blood or fast heart rates, please come back as this could be a clot (I highly doubt this is the cause of your pain).   Skin lesion: this should improve over time. If it gets red, painful or swollen, or if you develop fevers, chills, sweats, please return for evaluation.  Please make an appointment to see Dr. Lamar Benes for follow-up  If you have any questions or concerns, please do not hesitate to call the office at [phone].  Sincerely,  Cordelia Poche, MD

## 2015-04-01 NOTE — Progress Notes (Signed)
    Subjective   Erica Campbell is a 59 y.o. female that presents for a same day visit  1. Back pain: Symptoms started two weeks ago. At that time, she had a bus trip which was uncomfortable. Pain has improved since starting. Pain is worse with certain movements. Naproxen has been helping. She has had a history of associated dysuria and fullness. She has no history of fevers but reports some sweats yesterday. No nausea or vomiting. She is unsure about sick contacts but was around many children. She has been having bowel movements loose to runny stools but yesterday had a solid stool. She has bowel movements daily which help with her back discomfort.  2. Skin lesion: First noticed about one week ago. She feels like something bit her. There were scratches around the area and she is unsure as to how they got there. She has never had a history of skin infections. The lesion has been itchy with no pain or swelling.  ROS Per HPI  Social History  Substance Use Topics  . Smoking status: Former Smoker -- 3 years    Quit date: 04/29/1975  . Smokeless tobacco: Not on file  . Alcohol Use: Yes     Comment: special occasions only    Allergies  Allergen Reactions  . Codeine Other (See Comments)    unknown  . Iodine Itching    Objective   BP 152/86 mmHg  Pulse 76  Temp(Src) 99.4 F (37.4 C) (Oral)  Wt 190 lb 3.2 oz (86.274 kg)  General: Well appearing, no distress Respiratory/Chest: Clear with no diminished breath sounds Cardiovascular: Regular rate and rhythm Gastrointestinal: Soft, non-tender Musculoskeletal: tenderness under left lower ribs with no crepitus or abnormalities. No calf tenderness or swelling Skin: small cystic lesion on right shin with slight erythematous irritation that is non-tender with no purulence Psych: appears moderately anxious  Assessment and Plan   Meds ordered this encounter  Medications  . naproxen (NAPROSYN) 500 MG tablet    Sig: Take 1 tablet (500 mg  total) by mouth 2 (two) times daily with a meal.    Dispense:  30 tablet    Refill:  0    Musculoskeletal rib pain: likely related to recent irritation. Appears to be improving. Low suspicion for cardiac/pneumonia/PE causes  Naproxen for pain  Red flags discussed  Close follow-up if no improvement/worsening as patient is very anxious about possible serious medical problems, most relating to recurrence of cancer  Skin lesion: appears to be a small cystic lesion. Area does not look infected and is improving.  Allow to resolve spontaneously  Precautions given for infection, especially with low grade fever. If persisting, would recommend 7 day course of Keflex 500mg  QID

## 2015-04-02 ENCOUNTER — Ambulatory Visit: Payer: BC Managed Care – PPO | Admitting: Family Medicine

## 2015-04-22 ENCOUNTER — Other Ambulatory Visit: Payer: Self-pay | Admitting: *Deleted

## 2015-04-22 DIAGNOSIS — R0781 Pleurodynia: Secondary | ICD-10-CM

## 2015-04-23 MED ORDER — NAPROXEN 500 MG PO TABS: 500.0000 mg | ORAL_TABLET | Freq: Two times a day (BID) | ORAL | Status: DC

## 2015-07-22 ENCOUNTER — Other Ambulatory Visit: Payer: Self-pay | Admitting: Family Medicine

## 2015-07-25 ENCOUNTER — Other Ambulatory Visit: Payer: Self-pay | Admitting: Family Medicine

## 2015-08-02 ENCOUNTER — Ambulatory Visit (INDEPENDENT_AMBULATORY_CARE_PROVIDER_SITE_OTHER): Payer: BC Managed Care – PPO | Admitting: *Deleted

## 2015-08-02 DIAGNOSIS — Z23 Encounter for immunization: Secondary | ICD-10-CM

## 2015-08-04 ENCOUNTER — Other Ambulatory Visit: Payer: Self-pay | Admitting: Family Medicine

## 2015-11-24 ENCOUNTER — Other Ambulatory Visit: Payer: Self-pay | Admitting: Family Medicine

## 2015-11-28 ENCOUNTER — Encounter: Payer: BC Managed Care – PPO | Admitting: Family Medicine

## 2015-12-16 ENCOUNTER — Other Ambulatory Visit (HOSPITAL_COMMUNITY)
Admission: RE | Admit: 2015-12-16 | Discharge: 2015-12-16 | Disposition: A | Discharge status: Home / Self Care | Payer: BC Managed Care – PPO | Source: Ambulatory Visit | Attending: Family Medicine | Admitting: Family Medicine

## 2015-12-16 ENCOUNTER — Encounter: Payer: Self-pay | Admitting: Family Medicine

## 2015-12-16 ENCOUNTER — Ambulatory Visit (INDEPENDENT_AMBULATORY_CARE_PROVIDER_SITE_OTHER): Payer: BC Managed Care – PPO | Admitting: Family Medicine

## 2015-12-16 VITALS — BP 158/85 | HR 86 | Temp 98.6°F | Wt 185.0 lb

## 2015-12-16 DIAGNOSIS — Z1151 Encounter for screening for human papillomavirus (HPV): Secondary | ICD-10-CM | POA: Diagnosis present

## 2015-12-16 DIAGNOSIS — E119 Type 2 diabetes mellitus without complications: Secondary | ICD-10-CM

## 2015-12-16 DIAGNOSIS — Z1159 Encounter for screening for other viral diseases: Secondary | ICD-10-CM

## 2015-12-16 DIAGNOSIS — Z0000 Encounter for general adult medical examination without abnormal findings: Secondary | ICD-10-CM

## 2015-12-16 DIAGNOSIS — I10 Essential (primary) hypertension: Secondary | ICD-10-CM

## 2015-12-16 DIAGNOSIS — N898 Other specified noninflammatory disorders of vagina: Secondary | ICD-10-CM

## 2015-12-16 DIAGNOSIS — Z01419 Encounter for gynecological examination (general) (routine) without abnormal findings: Secondary | ICD-10-CM | POA: Insufficient documentation

## 2015-12-16 DIAGNOSIS — Z114 Encounter for screening for human immunodeficiency virus [HIV]: Secondary | ICD-10-CM

## 2015-12-16 DIAGNOSIS — E785 Hyperlipidemia, unspecified: Secondary | ICD-10-CM

## 2015-12-16 DIAGNOSIS — Z124 Encounter for screening for malignant neoplasm of cervix: Secondary | ICD-10-CM

## 2015-12-16 DIAGNOSIS — Z1211 Encounter for screening for malignant neoplasm of colon: Secondary | ICD-10-CM

## 2015-12-16 LAB — CBC
HEMATOCRIT: 37.4 % (ref 35.0–45.0)
HEMOGLOBIN: 12.2 g/dL (ref 11.7–15.5)
MCH: 26.5 pg — ABNORMAL LOW (ref 27.0–33.0)
MCHC: 32.6 g/dL (ref 32.0–36.0)
MCV: 81.3 fL (ref 80.0–100.0)
MPV: 10.4 fL (ref 7.5–12.5)
Platelets: 194 10*3/uL (ref 140–400)
RBC: 4.6 MIL/uL (ref 3.80–5.10)
RDW: 15.7 % — AB (ref 11.0–15.0)
WBC: 5.2 10*3/uL (ref 3.8–10.8)

## 2015-12-16 LAB — LIPID PANEL
Cholesterol: 143 mg/dL (ref 125–200)
HDL: 65 mg/dL (ref >=46)
LDL Cholesterol: 63 mg/dL (ref <130)
Total CHOL/HDL Ratio: 2.2 Ratio (ref <=5.0)
Triglycerides: 76 mg/dL (ref <150)
VLDL: 15 mg/dL (ref <30)

## 2015-12-16 LAB — BASIC METABOLIC PANEL WITH GFR
BUN: 12 mg/dL (ref 7–25)
CHLORIDE: 105 mmol/L (ref 98–110)
CO2: 23 mmol/L (ref 20–31)
Calcium: 9.6 mg/dL (ref 8.6–10.4)
Creat: 0.74 mg/dL (ref 0.50–0.99)
GFR, EST NON AFRICAN AMERICAN: 88 mL/min (ref >=60)
Glucose, Bld: 151 mg/dL — ABNORMAL HIGH (ref 65–99)
POTASSIUM: 4 mmol/L (ref 3.5–5.3)
SODIUM: 142 mmol/L (ref 135–146)

## 2015-12-16 LAB — POCT WET PREP (WET MOUNT): Clue Cells Wet Prep Whiff POC: NEGATIVE

## 2015-12-16 LAB — POCT GLYCOSYLATED HEMOGLOBIN (HGB A1C): HEMOGLOBIN A1C: 7.3

## 2015-12-16 MED ORDER — AMLODIPINE BESYLATE 5 MG PO TABS: 5.0000 mg | ORAL_TABLET | Freq: Every day | ORAL | Status: DC

## 2015-12-16 NOTE — Progress Notes (Signed)
   Subjective:    Patient ID: Erica Campbell, female    DOB: Apr 27, 1956, 60 y.o.   MRN: [id number]  HPI  CC: yearly  # Healthcare maintenance:  Due for pap; in [place] hysterectomy for ?fibroids. She believes still has cervix  Last colonoscopy was at outside state  Exercise: not doing much currently  # Hypertension  Took medicines today (amlodipine, atenolol, losartan-hctz)  Denies any side effects from the medications ROS: denies CP, SOB, HA  # Diabetes  Feels she is doing well  Says she has been adherent to metformin  Doesn't check CBGs, denies any symptoms of high or lows ROS: no polyuria  # Hyperlipidemia  On lovastatin, denies any current issues  No muscle aches/pains  # Vaginal discharge  Present "recently"  Somewhat bothersome, not painful, more itchy and uncomfortable  Social Hx: never smoker  Review of Systems   See HPI for ROS. Patient also filled out ROS sheet which was reviewed and is negative.  Past medical history, surgical, family, and social history reviewed and updated in the EMR as appropriate. Objective:  BP 158/85 mmHg  Pulse 86  Temp(Src) 98.6 F (37 C) (Oral)  Wt 185 lb (83.915 kg) Vitals and nursing note reviewed  General no apparent distress  Eyes: PERRL, EOMI ENTM: no oropharyngeal lesions Neck: normal, supple CV: normal rate, regular rhythm, no murmurs, rubs or gallop. 2+ radial pulses, no lower extremities edema Resp: clear to auscultation bilaterally, normal effort Abdomen: soft, nontender, nondistended, normal bowel sounds  GU: exam done with chaperone present. Normal external genitalia; tight introitus, there is what appears to be cervix present through not well defined, there is a significant amount of thin white/green discharge in the vaginal vault. Extremities: no edema, normal joints, normal muscle bulk and tone Neuro: alert and oriented, no gross deficits. Diabetic foot exam done, normal sensation to monofilament  testing  Assessment & Plan:  Healthcare maintenance HCV/HIV, mammogram, colonoscopy discussed. HCV/HIV ordered and is negative.  HTN (hypertension) Not at goal on current 4 drug regimen. Follow up 2 weeks to re-check in clinic. Check BMP and CBC today.  Hyperlipidemia with target LDL less than 100 Lipid panel checked today. She is on lovastatin, may beneift from switch to atorvastatin due to diabetes, though lipid panel shows her cholesterol is well controlled.  Controlled type 2 diabetes mellitus without complication [id number] still under 7.5, she is on suboptimal dosing of metformin but seems to be doing well. DMF foot exam done today, needs eye exam which she was instructed to get done and have result faxed to our clinic. Follow up 3 months.  Vaginal discharge Wet prep showed many bacteria but no clue cells. Discussed with patient and elected to treat as BV given clinical appearance with flagyl and diflucan if needed after. Follow up as needed.

## 2015-12-16 NOTE — Patient Instructions (Addendum)
Referral made for Colonoscopy  Call an optometrist or ophthalmologist for your Diabetic eye exam -- make sure to have them fax the results to our office  Come back in 2 weeks for repeat blood pressure check

## 2015-12-17 LAB — HEPATITIS C ANTIBODY: HCV AB: NEGATIVE

## 2015-12-17 LAB — HIV ANTIBODY (ROUTINE TESTING W REFLEX): HIV 1&2 Ab, 4th Generation: NONREACTIVE

## 2015-12-18 ENCOUNTER — Telehealth: Payer: Self-pay | Admitting: Family Medicine

## 2015-12-18 LAB — CYTOLOGY - PAP

## 2015-12-18 NOTE — Telephone Encounter (Signed)
Pt called because she said that the doctor was suppose to call in some medication for her discharge and pain. Can we get that called in. jw

## 2015-12-18 NOTE — Telephone Encounter (Signed)
Will forward to MD to advise. Jazmin Hartsell,CMA  

## 2015-12-19 ENCOUNTER — Encounter: Payer: Self-pay | Admitting: Family Medicine

## 2015-12-19 MED ORDER — METRONIDAZOLE 500 MG PO TABS: 500.0000 mg | ORAL_TABLET | Freq: Two times a day (BID) | ORAL | Status: AC

## 2015-12-19 MED ORDER — FLUCONAZOLE 150 MG PO TABS: 150.0000 mg | ORAL_TABLET | Freq: Once | ORAL | Status: DC

## 2015-12-19 NOTE — Telephone Encounter (Signed)
Called and spoke with husband Marland Kitchen, told him I would be sending in antibiotics to treat a possible bacterial overgrowth but there was no specific infection like yeast or BV that might have caused the discharge. Will send a letter with the other results (all negative)

## 2015-12-20 ENCOUNTER — Encounter: Payer: Self-pay | Admitting: Family Medicine

## 2015-12-22 DIAGNOSIS — Z0000 Encounter for general adult medical examination without abnormal findings: Secondary | ICD-10-CM | POA: Insufficient documentation

## 2015-12-22 NOTE — Assessment & Plan Note (Signed)
HCV/HIV, mammogram, colonoscopy discussed. HCV/HIV ordered and is negative.

## 2015-12-22 NOTE — Assessment & Plan Note (Addendum)
Lipid panel checked today. She is on lovastatin, may beneift from switch to atorvastatin due to diabetes, though lipid panel shows her cholesterol is well controlled.

## 2015-12-22 NOTE — Assessment & Plan Note (Signed)
A1c still under 7.5, she is on suboptimal dosing of metformin but seems to be doing well. DMF foot exam done today, needs eye exam which she was instructed to get done and have result faxed to our clinic. Follow up 3 months.

## 2015-12-22 NOTE — Assessment & Plan Note (Signed)
Not at goal on current 4 drug regimen. Follow up 2 weeks to re-check in clinic. Check BMP and CBC today.

## 2016-01-09 ENCOUNTER — Other Ambulatory Visit: Payer: Self-pay | Admitting: Family Medicine

## 2016-01-09 DIAGNOSIS — Z1231 Encounter for screening mammogram for malignant neoplasm of breast: Secondary | ICD-10-CM

## 2016-01-14 ENCOUNTER — Encounter: Payer: Self-pay | Admitting: Family Medicine

## 2016-01-14 ENCOUNTER — Ambulatory Visit (INDEPENDENT_AMBULATORY_CARE_PROVIDER_SITE_OTHER): Payer: BC Managed Care – PPO | Admitting: Family Medicine

## 2016-01-14 VITALS — BP 140/81 | HR 85 | Temp 98.7°F | Wt 187.0 lb

## 2016-01-14 DIAGNOSIS — I10 Essential (primary) hypertension: Secondary | ICD-10-CM | POA: Diagnosis not present

## 2016-01-14 DIAGNOSIS — E119 Type 2 diabetes mellitus without complications: Secondary | ICD-10-CM | POA: Diagnosis not present

## 2016-01-14 MED ORDER — ACCU-CHEK SOFTCLIX LANCETS MISC: Status: DC

## 2016-01-14 MED ORDER — GLUCOSE BLOOD VI STRP: ORAL_STRIP | Status: DC

## 2016-01-14 NOTE — Patient Instructions (Signed)
Continue your current blood pressure medicines.

## 2016-01-14 NOTE — Assessment & Plan Note (Signed)
At 140/90 goal, discussed with diabetes goal should be likely closer to 130/80. Will hold off on increasing amlodipine vs adding 5th medicine (spironolactone) and have her discuss with next PCP. Follow up 2-3 months

## 2016-01-14 NOTE — Assessment & Plan Note (Signed)
Up to date on DM healthcare maintenance, had eye exam on Saturday but haven't received faxed result yet. A1c due August, though likely can extend this out since she has done pretty well on just metformin and lifestyle changes. Of note she is still on just 500mg  metformin twice daily, not treatment dose of 1000mg  twice daily.

## 2016-01-14 NOTE — Progress Notes (Signed)
   Subjective:    Patient ID: Erica Campbell, female    DOB: Aug 09, 1955, 60 y.o.   MRN: [id number]  HPI  CC: follow up blood pressure  # Hypertension:  Doing well right now  Felt like it was a "rough start", felt like she was "heavy" and gaining weight.  Now says she is doing fine ROS: no chest pain, no shortness of breath   # Diabetes  Had retina exam on Saturday at [facility], reports no issues/retinopathy  Social Hx: former smoker (quit 1976)  Review of Systems   See HPI for ROS.   Past medical history, surgical, family, and social history reviewed and updated in the EMR as appropriate. Objective:  BP 140/81 mmHg  Pulse 85  Temp(Src) 98.7 F (37.1 C) (Oral)  Wt 187 lb (84.823 kg) Vitals and nursing note reviewed  General: no apparent distress  CV: normal rate, regular rhythm, no murmurs, rubs or gallop  Resp: clear to auscultation bilaterally, normal effort  Assessment & Plan:  Essential hypertension At 140/90 goal, discussed with diabetes goal should be likely closer to 130/80. Will hold off on increasing amlodipine vs adding 5th medicine (spironolactone) and have her discuss with next PCP. Follow up 2-3 months  Controlled type 2 diabetes mellitus without complication Up to date on DM healthcare maintenance, had eye exam on Saturday but haven't received faxed result yet. A1c due August, though likely can extend this out since she has done pretty well on just metformin and lifestyle changes. Of note she is still on just 500mg  metformin twice daily, not treatment dose of 1000mg  twice daily.     Return in about 3 months (around 04/15/2016).

## 2016-01-15 ENCOUNTER — Telehealth: Payer: Self-pay | Admitting: *Deleted

## 2016-01-15 MED ORDER — ONETOUCH DELICA LANCETS 33G MISC
Status: DC
Start: 2016-01-15 — End: 2017-11-23

## 2016-01-15 MED ORDER — GLUCOSE BLOOD VI STRP: ORAL_STRIP | Status: DC

## 2016-01-15 MED ORDER — ONETOUCH ULTRA 2 W/DEVICE KIT: PACK | Status: DC

## 2016-01-15 NOTE — Telephone Encounter (Addendum)
[facility] stating that patient's insurance will not cover Accu-Chek products.  Patient has BCBS of [place]; her plan will cover One Touch products.  New Rx for One Touch Meter, Test strips and lancets sent in to patient's pharmacy.  Derl Barrow, RN

## 2016-01-27 ENCOUNTER — Ambulatory Visit
Admission: RE | Admit: 2016-01-27 | Discharge: 2016-01-27 | Disposition: A | Discharge status: Home / Self Care | Payer: BC Managed Care – PPO | Source: Ambulatory Visit | Attending: Family Medicine | Admitting: Family Medicine

## 2016-01-27 ENCOUNTER — Other Ambulatory Visit: Payer: Self-pay | Admitting: Family Medicine

## 2016-01-27 DIAGNOSIS — Z1231 Encounter for screening mammogram for malignant neoplasm of breast: Secondary | ICD-10-CM

## 2016-01-29 ENCOUNTER — Other Ambulatory Visit: Payer: Self-pay | Admitting: Family Medicine

## 2016-01-30 ENCOUNTER — Other Ambulatory Visit: Payer: Self-pay | Admitting: Family Medicine

## 2016-01-30 DIAGNOSIS — R928 Other abnormal and inconclusive findings on diagnostic imaging of breast: Secondary | ICD-10-CM

## 2016-02-05 ENCOUNTER — Other Ambulatory Visit: Payer: Self-pay | Admitting: General Practice

## 2016-02-05 ENCOUNTER — Other Ambulatory Visit: Payer: Self-pay | Admitting: Family Medicine

## 2016-02-05 DIAGNOSIS — R928 Other abnormal and inconclusive findings on diagnostic imaging of breast: Secondary | ICD-10-CM

## 2016-02-06 ENCOUNTER — Ambulatory Visit
Admission: RE | Admit: 2016-02-06 | Discharge: 2016-02-06 | Disposition: A | Discharge status: Home / Self Care | Payer: BC Managed Care – PPO | Source: Ambulatory Visit | Attending: Family Medicine | Admitting: Family Medicine

## 2016-02-06 DIAGNOSIS — R928 Other abnormal and inconclusive findings on diagnostic imaging of breast: Secondary | ICD-10-CM

## 2016-03-02 ENCOUNTER — Encounter: Payer: Self-pay | Admitting: Family Medicine

## 2016-03-02 ENCOUNTER — Encounter: Payer: Self-pay | Admitting: *Deleted

## 2016-03-04 ENCOUNTER — Telehealth: Payer: Self-pay | Admitting: *Deleted

## 2016-03-04 ENCOUNTER — Ambulatory Visit (INDEPENDENT_AMBULATORY_CARE_PROVIDER_SITE_OTHER): Payer: BC Managed Care – PPO | Admitting: Internal Medicine

## 2016-03-04 ENCOUNTER — Encounter: Payer: Self-pay | Admitting: Internal Medicine

## 2016-03-04 ENCOUNTER — Other Ambulatory Visit: Payer: Self-pay | Admitting: Family Medicine

## 2016-03-04 VITALS — BP 151/78 | HR 79 | Temp 99.3°F | Ht 63.0 in | Wt 188.0 lb

## 2016-03-04 DIAGNOSIS — R42 Dizziness and giddiness: Secondary | ICD-10-CM | POA: Insufficient documentation

## 2016-03-04 DIAGNOSIS — J0101 Acute recurrent maxillary sinusitis: Secondary | ICD-10-CM | POA: Diagnosis not present

## 2016-03-04 MED ORDER — ONDANSETRON HCL 4 MG PO TABS: 4.0000 mg | ORAL_TABLET | Freq: Three times a day (TID) | ORAL | 0 refills | Status: DC | PRN

## 2016-03-04 MED ORDER — MECLIZINE HCL 32 MG PO TABS: 32.0000 mg | ORAL_TABLET | Freq: Three times a day (TID) | ORAL | 0 refills | Status: DC | PRN

## 2016-03-04 MED ORDER — FLUTICASONE PROPIONATE 50 MCG/ACT NA SUSP: 2.0000 | Freq: Every day | NASAL | 2 refills | Status: DC

## 2016-03-04 NOTE — Patient Instructions (Signed)
Can use Zofran for nausea. Use Antivert as needed for vertigo. Both of these medications will make you tired. Please use Flonase for at least two weeks to help with the congestion

## 2016-03-04 NOTE — Telephone Encounter (Signed)
Ph[facility] stating that Rx for meclizine (ANTIVERT) 32 MG tablet, is not available in that strength.  Verbal order given by Dr. Emmaline Life to change to meclizine 25 mg tablet. Will forward to Dr. Emmaline Life to update medication list.  Derl Barrow, RN

## 2016-03-04 NOTE — Progress Notes (Signed)
   [facility] Kerrin Mo, MD Phone: [phone]  Reason For Visit: SDA, Vertigo   #Patient indicates that following developing a cold the day before she started having vertigo yesterday. Indicates being congested and having a cough.  She explains that the room feels like it spinning around. Not associated with any type of movement of her head from left-to-right. No associated hearing loss. Patient does feel nauseated at times. However has not vomited. Most concern for her is the nausea.   Past Medical History Reviewed problem list.  Medications- reviewed and updated No additions to family history Social history- patient is a former smoker  Objective: BP (!) 151/78   Pulse 79   Temp 99.3 F (37.4 C) (Oral)   Ht 5\' 3"  (1.6 m)   Wt 188 lb (85.3 kg)   BMI 33.30 kg/m  Gen: NAD, alert, cooperative with exam HEENT: NCAT, PERRL, Right TM wnl, Left TM with slight dullness, erthyematous turbinates. Maxillary sinus pressure on palpation. Negative Dix-Hallpike maneuver  Neck: FROM, supple CV: RRR, good S1/S2, no murmur, 2+ radial pulses  Resp: CTABL, no wheezes, non-labored   Assessment/Plan: See problem based a/p  Vertigo 1. Vertigo - Likely due to middle ear congestion for sinusitis Will provide symptomatic treatment  - meclizine (ANTIVERT) 25 MG tablet; Take 1 tablet (25 mg total) by mouth 3 (three) times daily as needed.  Dispense: 20 tablet; Refill: 0 - ondansetron (ZOFRAN) 4 MG tablet; Take 1 tablet (4 mg total) by mouth every 8 (eight) hours as needed for nausea or vomiting.  Dispense: 20 tablet; Refill: 0     Sinus infection 2 day hx of nasal congestion and maxillary pressure  - - fluticasone (FLONASE) 50 MCG/ACT nasal spray; Place 2 sprays into both nostrils daily.  Dispense: 16 g; Refill: 2

## 2016-03-05 NOTE — Assessment & Plan Note (Signed)
2 day hx of nasal congestion and maxillary pressure  - - fluticasone (FLONASE) 50 MCG/ACT nasal spray; Place 2 sprays into both nostrils daily.  Dispense: 16 g; Refill: 2

## 2016-03-05 NOTE — Assessment & Plan Note (Signed)
1. Vertigo - Likely due to middle ear congestion for sinusitis Will provide symptomatic treatment  - meclizine (ANTIVERT) 25 MG tablet; Take 1 tablet (25 mg total) by mouth 3 (three) times daily as needed.  Dispense: 20 tablet; Refill: 0 - ondansetron (ZOFRAN) 4 MG tablet; Take 1 tablet (4 mg total) by mouth every 8 (eight) hours as needed for nausea or vomiting.  Dispense: 20 tablet; Refill: 0

## 2016-04-10 ENCOUNTER — Telehealth: Payer: Self-pay | Admitting: Internal Medicine

## 2016-04-10 NOTE — Telephone Encounter (Signed)
Pt is a 48 year breast cancer survivor and has had a big change in her breast. Implant may be failing. Pt is very concerned and would like for a nurse to call her on her cell phone, [phone]. Pt would also like a referral to [facility] on [address]. Please advise. Thanks! ep

## 2016-04-13 NOTE — Telephone Encounter (Signed)
Thinks that her breast implant has deflated or maybe shifted to the right. Has had it for 17 years and noticed the change maybe Thursday. No fevers or chills. No pain at that area. Reports sometimes "uncomfortable" which is somewhat chronic. The initial message noted that patient would like a referral to women's imaging. I discussed that it would be best if she is seen in clinic first to determine the best type of referral, for example surgery vs imaging. She reports that she has an appointment tomorrow in clinic.

## 2016-04-14 ENCOUNTER — Ambulatory Visit (INDEPENDENT_AMBULATORY_CARE_PROVIDER_SITE_OTHER): Payer: BC Managed Care – PPO | Admitting: Internal Medicine

## 2016-04-14 ENCOUNTER — Encounter: Payer: Self-pay | Admitting: Internal Medicine

## 2016-04-14 ENCOUNTER — Other Ambulatory Visit: Payer: Self-pay | Admitting: Family Medicine

## 2016-04-14 VITALS — BP 150/80 | HR 87 | Temp 98.7°F | Wt 188.0 lb

## 2016-04-14 DIAGNOSIS — I10 Essential (primary) hypertension: Secondary | ICD-10-CM

## 2016-04-14 DIAGNOSIS — T8549XA Other mechanical complication of breast prosthesis and implant, initial encounter: Secondary | ICD-10-CM | POA: Diagnosis not present

## 2016-04-14 DIAGNOSIS — T8543XA Leakage of breast prosthesis and implant, initial encounter: Secondary | ICD-10-CM

## 2016-04-14 HISTORY — DX: Leakage of breast prosthesis and implant, initial encounter: T85.43XA

## 2016-04-14 NOTE — Progress Notes (Signed)
[facility] Progress Note  Subjective:  Erica Campbell is a 60-y/o female who presents for issue with breast implant.  Breast implant: - Patient is a breast cancer survivor s/p mastectomy with saline implant placement 17 years ago in [place] R breast "deflating" last Thursday. Has also noticed extra tissue of R axilla. - Denies pain. Is aware of extra tissue when she puts her right arm down.  - Denies any trauma or injury to breast, though says she does not "baby it" and continues to lift weights, etc. - Has not seen any surgeons in [place] before - Normal L breast mammogram in July ROS: No fever  HTN: - Taking losartan-HCTZ 100-25 mg daily, atenolol 50 mg daily, and was most recently started on amlodipine 5 mg daily (in April) - Does report a lot of anxiety surrounding taking blood pressure and coming to doctor - Has not been taking blood pressure outside of doctor's visits ROS: No headaches, CP  Objective: Blood pressure (!) 164/86, pulse 87, temperature 98.7 F (37.1 C), temperature source Oral, weight 188 lb (85.3 kg). Repeat measurements were 150/80 in the left arm and 145/80 in the right arm.  Constitutional: Well appearing female in NAD  Musculoskeletal: R breast markedly less full than L with palpable movement of fluid within R breast when palpating lower quadrants.  Skin: Skin is warm and dry. No rash noted. No erythema.  Psychiatric: Normal mood and anxious affect.  Vitals reviewed  Assessment/Plan: Ruptured right breast implant - High suspicion for ruptured implant given exam. Patient's vital signs are stable, and she is without pain. [facility] and spoke with Dr. Melanee Spry about need for possible imaging. He said saline rupture is typical identified clinically. If there is uncertainty of diagnosis, could consider MRI. - Placed urgent referral to Plastic Surgery for further evaluation.  Essential hypertension - Not  well-controlled based on recent BP readings. Question contribution of anxiety. - Recommended ambulatory blood pressure monitoring. Asked patient to schedule appointment with Dr. Valentina Lucks. - If BP consistently elevated with 24-hr monitoring, would likely increase amlodipine vs. add spironolactone.  Follow-up with Dr. Valentina Lucks for ambulatory blood pressure monitoring or if no availability in nursing clinic for BP recheck within 2 weeks.  Olene Floss, MD [place], PGY-2

## 2016-04-14 NOTE — Patient Instructions (Signed)
Ms. Napierkowski,  Thank you for your patience today!  I will place a referral to a plastic surgeon. If you do not hear about an appointment within the next day or 2, please call clinic and I will look into this.  I recommend making an appointment with Dr. Valentina Lucks for ambulatory blood pressure monitoring. I anticipate needing to increase your blood pressure medication but think it would be worthwhile to see what your blood pressures do outside of the clinical setting before continuing to add medications.  Thank you, Dr. Ola Spurr

## 2016-04-14 NOTE — Assessment & Plan Note (Signed)
-   Not well-controlled based on recent BP readings. Question contribution of anxiety. - Recommended ambulatory blood pressure monitoring. Asked patient to schedule appointment with Dr. Valentina Lucks. - If BP consistently elevated with 24-hr monitoring, would likely increase amlodipine vs. add spironolactone.

## 2016-04-14 NOTE — Assessment & Plan Note (Signed)
-   High suspicion for ruptured implant given exam. Patient's vital signs are stable, and she is without pain. [facility] and spoke with Dr. Melanee Spry about need for possible imaging. He said saline rupture is typical identified clinically. If there is uncertainty of diagnosis, could consider MRI. - Placed urgent referral to Plastic Surgery for further evaluation.

## 2016-04-16 ENCOUNTER — Ambulatory Visit (INDEPENDENT_AMBULATORY_CARE_PROVIDER_SITE_OTHER): Payer: BC Managed Care – PPO | Admitting: Pharmacist

## 2016-04-16 DIAGNOSIS — I10 Essential (primary) hypertension: Secondary | ICD-10-CM | POA: Diagnosis not present

## 2016-04-16 NOTE — Patient Instructions (Addendum)
Wearing the Blood Pressure Monitor  The cuff will inflate every 20 minutes during the day and every 30 minutes while you sleep.  Your blood pressure readings will NOT display after cuff inflation  Fill out the blood pressure-activity diary during the day, especially during activities that may affect your reading -- such as exercise, stress, walking, taking your blood pressure medications  Important things to know:  Avoid taking the monitor off for the next 24 hours, unless it causes you discomfort or pain.  Do NOT get the monitor wet and do NOT dry to clean the monitor with any cleaning products.  Do NOT drop the monitor.  Do NOT put the monitor on anyone else's arm.  When the cuff inflates, avoid excess movement. Let the cuffed arm hang loosely, slightly away from the body. Avoid flexing the muscles or moving the hand/fingers.  When you go to sleep, make sure that the hose is not kinked.  Remember to fill out the blood pressure activity diary.  If you experience severe pain or unusual pain (not associated with getting your blood pressure checked), remove the monitor.  Troubleshooting:  Code  Troubleshooting   1  Check cuff position, tighten cuff   2, 3  Remain still during reading   4, 87  Check air hose connections and make sure cuff is tight   85, 89  Check hose connections and make tubing is not crimped   86  Push START/STOP to restart reading   88, 91  Retry by pushing START/STOP   90  Replace batteries. If problem persists, remove monitor and bring back to   clinic at follow up   97, 98, 99  Service required - Remove monitor and bring back to clinic at follow up    Blood Pressure Activity Diary  Time Lying down/ Sleeping Walking/ Exercise Stressed/ Angry Headache/ Pain Dizzy  9 AM       10 AM       11 AM       12 PM       1 PM       2 PM       Time Lying down/ Sleeping Walking/ Exercise Stressed/ Angry Headache/ Pain Dizzy  3 PM       4 PM        5 PM       6  PM       7 PM       8 PM       Time Lying down/ Sleeping Walking/ Exercise Stressed/ Angry Headache/ Pain Dizzy  9 PM       10 PM       11 PM       12 AM       1 AM       2 AM       3 AM       Time Lying down/ Sleeping Walking/ Exercise Stressed/ Angry Headache/ Pain Dizzy  4 AM       5 AM       6 AM       7 AM       8 AM       9 AM       10 AM        Time you woke up: _________                  Time you went to sleep:__________  Come back tomorrow at ___________ to have the monitor removed  Call the [facility] if you have any questions before then [phone])   Day - 2  Results of the BP measurements were well controlled during the test.   NO changes in Blood pressure medications at this time.   Follow up with PCP as needed.

## 2016-04-16 NOTE — Progress Notes (Signed)
   S:    Patient arrives in good spirits.    Presents to the clinic for ambulatory blood pressure evaluation.  Patient was referred on 04/14/16 by Dr. Ola Spurr.   Diagnosed with Hypertension in the year of 1993.    Medication compliance is reported to be excellent.  Discussed procedure for wearing the monitor and gave patient written instructions. Monitor was placed on non-dominant arm with instructions to return in the morning.   Patient returned the following morning to return the device and for ambulatory blood pressure evaluation.    Current BP Medications include:  Amlodipine, losartan/hctz, atenolol  Antihypertensives tried in the past include:  n/a   O:   Last 3 Office BP readings: BP Readings from Last 3 Encounters:  04/16/16 (!) 154/87  04/14/16 (!) 150/80  03/04/16 (!) 151/78      ABPM Study Data: Arm Placement left arm.   Overall Mean 24hr BP:   133/75 mmHg HR: 88   Daytime Mean BP:  140/80 mmHg HR: 91   Nighttime Mean BP:  118/64 mmHg HR: 83   Dipping Pattern: Yes.    Sys:   15.5%   Dia: 19.6%   [normal dipping ~10-20%]   Non-hypertensive ABPM thresholds: daytime BP <135/85 mmHg, sleeptime BP <120/70 mmHg NICE Hypertension Guidelines ([place]) using ABPM: Stage I: >135/85 mmHg, Stage 2: >150/95 mmHg)   BMET    Component Value Date/Time   NA 142 12/16/2015 0954   NA 143 01/19/2014 1444   K 4.0 12/16/2015 0954   K 3.4 (L) 01/19/2014 1444   CL 105 12/16/2015 0954   CO2 23 12/16/2015 0954   CO2 29 01/19/2014 1444   GLUCOSE 151 (H) 12/16/2015 0954   GLUCOSE 148 (H) 01/19/2014 1444   BUN 12 12/16/2015 0954   BUN 13.5 01/19/2014 1444   CREATININE 0.74 12/16/2015 0954   CREATININE 0.9 01/19/2014 1444   CALCIUM 9.6 12/16/2015 0954   CALCIUM 9.7 01/19/2014 1444   GFRNONAA 88 12/16/2015 0954   GFRAA >89 12/16/2015 0954    A/P:  History of hypertension since 1993 found to have isolated systolic hypertension given 24-hour ambulatory blood pressure  demonstrates good blood pressure control at present with an average blood pressure of 133/75 mmHg, and a nocturnal dipping pattern that is normal.  No changes to medications are warranted at this time.  Will consider changing patient's atenolol at a future visit due to concern for atenolol shortages and better CV outcome data with agents such as carvedilol or metoprolol.     Results reviewed and written information provided.  Total time in face-to-face counseling 20 minutes.   F/U Clinic Visit with PCP as needed.  Patient seen with Mechele Dawley, PharmD Candidate and Joellyn Haff, PharmD Candidate.

## 2016-04-17 ENCOUNTER — Encounter: Payer: Self-pay | Admitting: Pharmacist

## 2016-04-17 NOTE — Progress Notes (Signed)
Patient ID: Erica Campbell, female   DOB: Sep 28, 1955, 60 y.o.   MRN: [id number] Reviewed: Agree with Dr. Graylin Shiver documentation and management.

## 2016-04-17 NOTE — Assessment & Plan Note (Signed)
History of hypertension since 1993 found to have isolated systolic hypertension given 24-hour ambulatory blood pressure demonstrates good blood pressure control at present with an average blood pressure of 133/75 mmHg, and a nocturnal dipping pattern that is normal.  No changes to medications are warranted at this time.  Will consider changing patient's atenolol at a future visit due to concern for atenolol shortages and better CV outcome data with agents such as carvedilol or metoprolol.

## 2016-05-21 ENCOUNTER — Other Ambulatory Visit: Payer: Self-pay | Admitting: Family Medicine

## 2016-05-21 DIAGNOSIS — J309 Allergic rhinitis, unspecified: Secondary | ICD-10-CM

## 2016-06-17 ENCOUNTER — Ambulatory Visit (INDEPENDENT_AMBULATORY_CARE_PROVIDER_SITE_OTHER): Payer: BC Managed Care – PPO | Admitting: Family Medicine

## 2016-06-17 ENCOUNTER — Ambulatory Visit: Payer: BC Managed Care – PPO | Admitting: Family Medicine

## 2016-06-17 ENCOUNTER — Encounter: Payer: Self-pay | Admitting: Family Medicine

## 2016-06-17 VITALS — BP 153/89 | HR 88 | Temp 98.4°F | Wt 189.8 lb

## 2016-06-17 DIAGNOSIS — J0100 Acute maxillary sinusitis, unspecified: Secondary | ICD-10-CM | POA: Diagnosis not present

## 2016-06-17 MED ORDER — AMOXICILLIN-POT CLAVULANATE 875-125 MG PO TABS: 1.0000 | ORAL_TABLET | Freq: Two times a day (BID) | ORAL | 0 refills | Status: DC

## 2016-06-17 NOTE — Progress Notes (Signed)
   Subjective:   Erica Campbell is a 60 y.o. female with a history of T2 DM, HTN, asthma, allergic rhinitis here for same day appointment for cough and sinus pain  Patient reports feeling unwell with a cough since 05/02/16. She thinks she had bronchitis at that time. She continues to have intermittent dry cough.  She was initially feeling better, but then worsened. She is more concerned now because she has had sinus pain, pressure, congestion for the last 7-9 days. Nasal drainage was dark in color. She also reports associated headaches, subjective fevers, chills, and diarrhea this morning. She is tried Motrin, rest, Flonase but continues to worsen.  Review of Systems:  Per HPI.   Social History: former smoker  Objective:  BP (!) 153/89   Pulse 88   Temp 98.4 F (36.9 C) (Oral)   Wt 189 lb 12.8 oz (86.1 kg)   BMI 33.62 kg/m   Gen:  60 y.o. female in NAD HEENT: NCAT, MMM, EOMI, PERRL, anicteric sclerae, OP clear, TMs clear bilaterally, nasal turbinates inflamed, TTP over frontal and maxillary sinuses Neck: No LAD CV: RRR, no MRG Resp: Non-labored, CTAB, no wheezes noted Ext: WWP, no edema MSK: No obvious deformities, gait intact Neuro: Alert and oriented, speech normal     Assessment & Plan:     Erica Campbell is a 60 y.o. female here for   Sinusitis, acute Symptoms and time course consistent with likely bacterial rhinosinusitis Lungs clear without evidence of pneumonia or bronchitis Treat with 7 day course of Augmentin If patient has recurrent sinusitis, consider ENT referral Return precautions discussed   Virginia Crews, MD MPH PGY-3,  [facility] 06/17/2016  9:59 AM

## 2016-06-17 NOTE — Patient Instructions (Addendum)
Nice to meet you today. Take Augmentin twice daily for the next 7 days for sinus infection. Come and see [place] if you're not getting better symptoms get worse.  Follow-up with primary care doctor for your blood pressure.  Take care, Dr. B   Sinusitis, Adult Sinusitis is soreness and inflammation of your sinuses. Sinuses are hollow spaces in the bones around your face. They are located:  Around your eyes.  In the middle of your forehead.  Behind your nose.  In your cheekbones. Your sinuses and nasal passages are lined with a stringy fluid (mucus). Mucus normally drains out of your sinuses. When your nasal tissues get inflamed or swollen, the mucus can get trapped or blocked so air cannot flow through your sinuses. This lets bacteria, viruses, and funguses grow, and that leads to infection. Follow these instructions at home: Medicines  Take, use, or apply over-the-counter and prescription medicines only as told by your doctor. These may include nasal sprays.  If you were prescribed an antibiotic medicine, take it as told by your doctor. Do not stop taking the antibiotic even if you start to feel better. Hydrate and Humidify  Drink enough water to keep your pee (urine) clear or pale yellow.  Use a cool mist humidifier to keep the humidity level in your home above 50%.  Breathe in steam for 10-15 minutes, 3-4 times a day or as told by your doctor. You can do this in the bathroom while a hot shower is running.  Try not to spend time in cool or dry air. Rest  Rest as much as possible.  Sleep with your head raised (elevated).  Make sure to get enough sleep each night. General instructions  Put a warm, moist washcloth on your face 3-4 times a day or as told by your doctor. This will help with discomfort.  Wash your hands often with soap and water. If there is no soap and water, use hand sanitizer.  Do not smoke. Avoid being around people who are smoking (secondhand smoke).  Keep  all follow-up visits as told by your doctor. This is important. Contact a doctor if:  You have a fever.  Your symptoms get worse.  Your symptoms do not get better within 10 days. Get help right away if:  You have a very bad headache.  You cannot stop throwing up (vomiting).  You have pain or swelling around your face or eyes.  You have trouble seeing.  You feel confused.  Your neck is stiff.  You have trouble breathing. This information is not intended to replace advice given to you by your health care provider. Make sure you discuss any questions you have with your health care provider. Document Released: 01/06/2008 Document Revised: 03/15/2016 Document Reviewed: 05/15/2015 Elsevier Interactive Patient Education  2017 [employer].

## 2016-06-17 NOTE — Assessment & Plan Note (Signed)
Symptoms and time course consistent with likely bacterial rhinosinusitis Lungs clear without evidence of pneumonia or bronchitis Treat with 7 day course of Augmentin If patient has recurrent sinusitis, consider ENT referral Return precautions discussed

## 2016-07-18 ENCOUNTER — Other Ambulatory Visit: Payer: Self-pay | Admitting: Family Medicine

## 2016-07-22 ENCOUNTER — Ambulatory Visit (INDEPENDENT_AMBULATORY_CARE_PROVIDER_SITE_OTHER): Payer: BC Managed Care – PPO | Admitting: Family Medicine

## 2016-07-22 ENCOUNTER — Other Ambulatory Visit: Payer: Self-pay | Admitting: *Deleted

## 2016-07-22 ENCOUNTER — Encounter: Payer: Self-pay | Admitting: Family Medicine

## 2016-07-22 VITALS — BP 148/86 | HR 84 | Temp 98.6°F | Ht 63.0 in | Wt 190.2 lb

## 2016-07-22 DIAGNOSIS — J329 Chronic sinusitis, unspecified: Secondary | ICD-10-CM | POA: Diagnosis not present

## 2016-07-22 DIAGNOSIS — I10 Essential (primary) hypertension: Secondary | ICD-10-CM | POA: Diagnosis not present

## 2016-07-22 MED ORDER — OXYMETAZOLINE HCL 0.05 % NA SOLN: 1.0000 | Freq: Two times a day (BID) | NASAL | 0 refills | Status: DC

## 2016-07-22 MED ORDER — AMOXICILLIN-POT CLAVULANATE 875-125 MG PO TABS: 1.0000 | ORAL_TABLET | Freq: Two times a day (BID) | ORAL | 0 refills | Status: DC

## 2016-07-22 MED ORDER — FLUTICASONE PROPIONATE 50 MCG/ACT NA SUSP: 2.0000 | Freq: Every day | NASAL | 6 refills | Status: DC

## 2016-07-22 NOTE — Patient Instructions (Addendum)
Thank you for coming in today, it was so nice to see you! Today we talked about:    Sinusitis: I think that you have chronic sinusitis problems. We will give him another course of Augmentin antibiotics. I have also sent a prescription to pharmacy for Flonase nose spray. Please use this every day in each nostril. I've also sent in a prescription for Afrin nose spray which you should only use before flying. The Afrin nose spray will help you do not have pain in your sinuses with the change in the air pressure.  I have placed a referral for an ear nose and throat doctor. So will call you to schedule this appointment  Your blood pressure was also slightly high, I suggest you discuss this with your primary care physician   If you have any questions or concerns, please do not hesitate to call the office at [phone]. You can also message me directly via MyChart.   Sincerely,  Smitty Cords, MD    Sinusitis, Adult Sinusitis is soreness and inflammation of your sinuses. Sinuses are hollow spaces in the bones around your face. They are located:  Around your eyes.  In the middle of your forehead.  Behind your nose.  In your cheekbones. Your sinuses and nasal passages are lined with a stringy fluid (mucus). Mucus normally drains out of your sinuses. When your nasal tissues get inflamed or swollen, the mucus can get trapped or blocked so air cannot flow through your sinuses. This lets bacteria, viruses, and funguses grow, and that leads to infection. Follow these instructions at home: Medicines  Take, use, or apply over-the-counter and prescription medicines only as told by your doctor. These may include nasal sprays.  If you were prescribed an antibiotic medicine, take it as told by your doctor. Do not stop taking the antibiotic even if you start to feel better. Hydrate and Humidify  Drink enough water to keep your pee (urine) clear or pale yellow.  Use a cool mist humidifier to  keep the humidity level in your home above 50%.  Breathe in steam for 10-15 minutes, 3-4 times a day or as told by your doctor. You can do this in the bathroom while a hot shower is running.  Try not to spend time in cool or dry air. Rest  Rest as much as possible.  Sleep with your head raised (elevated).  Make sure to get enough sleep each night. General instructions  Put a warm, moist washcloth on your face 3-4 times a day or as told by your doctor. This will help with discomfort.  Wash your hands often with soap and water. If there is no soap and water, use hand sanitizer.  Do not smoke. Avoid being around people who are smoking (secondhand smoke).  Keep all follow-up visits as told by your doctor. This is important. Contact a doctor if:  You have a fever.  Your symptoms get worse.  Your symptoms do not get better within 10 days. Get help right away if:  You have a very bad headache.  You cannot stop throwing up (vomiting).  You have pain or swelling around your face or eyes.  You have trouble seeing.  You feel confused.  Your neck is stiff.  You have trouble breathing. This information is not intended to replace advice given to you by your health care provider. Make sure you discuss any questions you have with your health care provider. Document Released: 01/06/2008 Document Revised: 03/15/2016 Document Reviewed:  05/15/2015 Elsevier Interactive Patient Education  2017 [employer].

## 2016-07-22 NOTE — Telephone Encounter (Signed)
Discussed with patient today that she can have up to 800 mg of ibuprofen if needed for pain. Tramadol not indicated for sinus pain and will not help with inflammation. [facility] team Please inform patient. Thank you.

## 2016-07-22 NOTE — Assessment & Plan Note (Signed)
Blood pressure was slightly elevated this visit, initially 148/86 then 142/86 when rechecked manually. She states she has not taken her medications at this morning. -advised the patient follow-up with PCP as her medications may need to be adjusted -Advised patient to check blood pressure daily and keep a recording

## 2016-07-22 NOTE — Telephone Encounter (Signed)
2nd request. Pt is going out of town in the morning and would like to be able to pick up a few tramadol for the pain. Pt would like to be contacted once Rx is ready for pick up. Thanks! ep

## 2016-07-22 NOTE — Progress Notes (Signed)
   Subjective:    Patient ID: Erica Campbell , female   DOB: [birth year] , 60 y.o..   MRN: [id number]  HPI  Deangel Zajac is here for  Chief Complaint  Patient presents with  . Sinus Problem   Sinusitis: Patient states that she has had 3 episodes of sinus infections of the last year. Her most recent sinus infection was November. She went to the doctor November 15 and was given a seven-day course of Augmentin. Her symptoms resolved afterwards but then reappeared last week. She notes pain in her face that is even felt in her teeth. She has been using nasal spray but has not provided any relief. She admits to subjective fevers and chills at home. Denies any weakness, nausea, vomiting, diarrhea, abdominal pain, trouble swallowing, neck pain, change in vision, confusion.  Review of Systems: Per HPI. All other systems reviewed and are negative.  Medications: reviewed   Social Hx:  reports that she quit smoking about 41 years ago. She quit after 3.00 years of use. She does not have any smokeless tobacco history on file.   Objective:   BP (!) 148/86   Pulse 84   Temp 98.6 F (37 C) (Oral)   Ht 5\' 3"  (1.6 m)   Wt 190 lb 3.2 oz (86.3 kg)   SpO2 96%   BMI 33.69 kg/m  Physical Exam  Gen: NAD, alert, cooperative with exam, well-appearing HEENT: NCAT, PERRL, clear conjunctiva, oropharynx clear, supple neck, tender to palpation of frontal and ethmoidal sinuses. Significant nasal mucosal edema and erythema bilateral Erythema.  Cardiac: Regular rate and rhythm, normal S1/S2, no murmur, no edema, capillary refill brisk  Respiratory: Clear to auscultation bilaterally, no wheezes, non-labored breathing Skin: no rashes, normal turgor  Neurological: no gross deficits.  Psych: good insight, normal mood and affect   Assessment & Plan:  Sinusitis, chronic Patient meeting criteria for chronic sinusitis with recurrent episodes. Currently having facial pain with subjective fevers and chills. Vital signs  stable and patient afebrile here in clinic. Physical exam showing tenderness to palpation of frontal sinus as well as significant nasal mucosal edema and erythema.  -We'll treat again with 7 day course of Augmentin -Ibuprofen when necessary for facial pain, discussed that she can take up to 800 mg daily if needed -Prescription for Flonase given -Prescription for Afrin given for short term management -Referral made to ENT   Essential hypertension Blood pressure was slightly elevated this visit, initially 148/86 then 142/86 when rechecked manually. She states she has not taken her medications at this morning. -advised the patient follow-up with PCP as her medications may need to be adjusted -Advised patient to check blood pressure daily and keep a recording   Smitty Cords, MD [place], PGY-2

## 2016-07-22 NOTE — Telephone Encounter (Signed)
Patient states she was seen by Dr. Juanito Doom yesterday for a sinus infection and was told to take motrin for the pain. Patient states she has taken this and it has not helped at all. She states she will traveling out of town tomorrow for the holiday and wants to know if MD could prescribe a small rx of something stronger for the pain.

## 2016-07-22 NOTE — Telephone Encounter (Signed)
Patient informed and expressed understanding. Nat Christen, CMA

## 2016-07-22 NOTE — Assessment & Plan Note (Addendum)
Patient meeting criteria for chronic sinusitis with recurrent episodes. Currently having facial pain with subjective fevers and chills. Vital signs stable and patient afebrile here in clinic. Physical exam showing tenderness to palpation of frontal sinus as well as significant nasal mucosal edema and erythema.  -We'll treat again with 7 day course of Augmentin -Ibuprofen when necessary for facial pain, discussed that she can take up to 800 mg daily if needed -Prescription for Flonase given -Prescription for Afrin given for short term management -Referral made to ENT

## 2016-08-12 ENCOUNTER — Other Ambulatory Visit: Payer: Self-pay | Admitting: Internal Medicine

## 2016-11-23 ENCOUNTER — Other Ambulatory Visit: Payer: Self-pay | Admitting: *Deleted

## 2016-11-23 MED ORDER — LOSARTAN POTASSIUM-HCTZ 100-25 MG PO TABS: 1.0000 | ORAL_TABLET | Freq: Every day | ORAL | 0 refills | Status: DC

## 2016-11-23 NOTE — Telephone Encounter (Signed)
Spoke with husband and patient is still working but he will relay the message to her to call the office back. Jazmin Hartsell,CMA

## 2016-11-23 NOTE — Telephone Encounter (Signed)
Please ask patient to make a follow up appointment to check electrolytes since she is on this medication.

## 2016-12-04 ENCOUNTER — Ambulatory Visit: Payer: BC Managed Care – PPO | Admitting: Family Medicine

## 2016-12-08 ENCOUNTER — Other Ambulatory Visit: Payer: Self-pay | Admitting: Internal Medicine

## 2016-12-10 ENCOUNTER — Other Ambulatory Visit: Payer: Self-pay | Admitting: *Deleted

## 2016-12-10 DIAGNOSIS — J0101 Acute recurrent maxillary sinusitis: Secondary | ICD-10-CM

## 2016-12-10 DIAGNOSIS — R42 Dizziness and giddiness: Secondary | ICD-10-CM

## 2016-12-10 MED ORDER — MECLIZINE HCL 32 MG PO TABS: 32.0000 mg | ORAL_TABLET | Freq: Three times a day (TID) | ORAL | 0 refills | Status: DC | PRN

## 2016-12-14 ENCOUNTER — Ambulatory Visit (INDEPENDENT_AMBULATORY_CARE_PROVIDER_SITE_OTHER): Payer: BC Managed Care – PPO | Admitting: Internal Medicine

## 2016-12-14 ENCOUNTER — Encounter: Payer: Self-pay | Admitting: Internal Medicine

## 2016-12-14 VITALS — BP 119/80 | HR 91 | Temp 98.3°F | Ht 63.0 in | Wt 182.8 lb

## 2016-12-14 DIAGNOSIS — E119 Type 2 diabetes mellitus without complications: Secondary | ICD-10-CM

## 2016-12-14 DIAGNOSIS — Z1211 Encounter for screening for malignant neoplasm of colon: Secondary | ICD-10-CM | POA: Diagnosis not present

## 2016-12-14 DIAGNOSIS — I10 Essential (primary) hypertension: Secondary | ICD-10-CM

## 2016-12-14 DIAGNOSIS — E785 Hyperlipidemia, unspecified: Secondary | ICD-10-CM

## 2016-12-14 LAB — POCT GLYCOSYLATED HEMOGLOBIN (HGB A1C): HEMOGLOBIN A1C: 7

## 2016-12-14 NOTE — Progress Notes (Signed)
   [facility] Phone: [phone]   Date of Visit: 12/14/2016   HPI:  HTN:  - did not take BP meds for 2 days but took today.  - does not check BP at home - Medications include: Norvasc 5mg  daily, Atenolol 50mg  daily, Hyzaar 100-25mg  daily - denies chest pain, shortness of breath, HA, blurred vision   DM2:  - A1c 7.3 (12/2015) > 7.0 (12/2016) today  - Medications: Metformin 500mg  BID  - stopped checking cbgs because it was controlled  - is watching watch she eats - is exercising: walking the campus   Colon Cancer Screening: per oncology, patient was referred to GI for screening colonoscopy in 2015. Patient reports that she did not go.   ROS: See HPI.  [place]:  PMH: HTN Allergic Rhinitis  Asthma/ Chronic Sinusitis  DM2 History of Vit D Deficiency History of Breast Cancer with mastectomy R breast 1999 Littoral Cell Angioma  HLD  Microcytic Anemia Vertigo   PHYSICAL EXAM: BP 119/80 (BP Location: Right Arm, Patient Position: Sitting, Cuff Size: Large)   Pulse 91   Temp 98.3 F (36.8 C) (Oral)   Ht 5\' 3"  (1.6 m)   Wt 182 lb 12.8 oz (82.9 kg)   SpO2 98%   BMI 32.38 kg/m  GEN: NAD CV: RRR, no murmurs, rubs, or gallops PULM: CTAB, normal effort SKIN: No rash or cyanosis; warm and well-perfused EXTR: No lower extremity edema or calf tenderness PSYCH: Mood and affect euthymic, normal rate and volume of speech NEURO: Awake, alert, no focal deficits grossly, normal speech  ASSESSMENT/PLAN:  Health maintenance:  - GI referral for colon cancer screening  - given information to schedule mammogram   Essential hypertension Controlled on current medications. Will repeat BMP (future order along with lipid). Follow up in 3 months.   Controlled type 2 diabetes mellitus without complication D9R improved. Continue Metformin. BMP as future order.   Smiley Houseman, MD PGY [place]

## 2016-12-14 NOTE — Patient Instructions (Addendum)
Please make a lab visit to get your cholesterol, kidney function and electrolytes checked.  Please follow up in 3 months for Diabetes. Below are some recommendations about diet.  Try to increase your physical activity  Make an appointment for mammogram  I made a referral to the GI doctors   Diet Recommendations for Diabetes   Starchy (carb) foods include: Bread, rice, pasta, potatoes, corn, crackers, bagels, muffins, all baked goods.  (Fruits, milk, and yogurt also have carbohydrate, but most of these foods will not spike your blood sugar as the starchy foods will.)  A few fruits do cause high blood sugars; use small portions of bananas (limit to 1/2 at a time), grapes, and most tropical fruits.    Protein foods include: Meat, fish, poultry,  eggs, dairy foods, and beans such as pinto and kidney beans (beans also provide carbohydrate).   1. Eat at least 3 meals and 1-2 snacks per day. Never go more than 4-5 hours while awake without eating.  2. Limit starchy foods to TWO per meal and ONE per snack. ONE portion of a starchy  food is equal to the following:   - ONE slice of bread (or its equivalent, such as half of a hamburger bun).   - 1/2 cup of a "scoopable" starchy food such as potatoes or rice.   - 15 grams of carbohydrate as shown on food label.  3. Both lunch and dinner should include a protein food, a carb food, and vegetables.   - Obtain twice as many veg's as protein or carbohydrate foods for both lunch and dinner.   - Fresh or frozen veg's are best.   - Try to keep frozen veg's on hand for a quick vegetable serving.    4. Breakfast should always include protein.

## 2016-12-15 ENCOUNTER — Other Ambulatory Visit: Payer: Self-pay | Admitting: Internal Medicine

## 2016-12-15 MED ORDER — MECLIZINE HCL 25 MG PO TABS: 25.0000 mg | ORAL_TABLET | Freq: Three times a day (TID) | ORAL | 0 refills | Status: DC | PRN

## 2016-12-16 ENCOUNTER — Other Ambulatory Visit: Payer: Self-pay | Admitting: Internal Medicine

## 2016-12-16 NOTE — Assessment & Plan Note (Addendum)
Controlled on current medications. Will repeat BMP (future order along with lipid). Follow up in 3 months.

## 2016-12-16 NOTE — Assessment & Plan Note (Signed)
A1c improved. Continue Metformin. BMP as future order.

## 2017-02-08 ENCOUNTER — Other Ambulatory Visit: Payer: Self-pay | Admitting: *Deleted

## 2017-02-08 MED ORDER — LOVASTATIN 20 MG PO TABS: ORAL_TABLET | ORAL | 3 refills | Status: DC

## 2017-02-08 MED ORDER — METFORMIN HCL 500 MG PO TABS: 500.0000 mg | ORAL_TABLET | Freq: Two times a day (BID) | ORAL | 3 refills | Status: DC

## 2017-02-18 ENCOUNTER — Ambulatory Visit (INDEPENDENT_AMBULATORY_CARE_PROVIDER_SITE_OTHER): Payer: BC Managed Care – PPO | Admitting: Internal Medicine

## 2017-02-18 ENCOUNTER — Encounter: Payer: Self-pay | Admitting: Internal Medicine

## 2017-02-18 VITALS — BP 138/88 | HR 81 | Temp 98.5°F | Ht 63.0 in | Wt 184.2 lb

## 2017-02-18 DIAGNOSIS — H01004 Unspecified blepharitis left upper eyelid: Secondary | ICD-10-CM

## 2017-02-18 DIAGNOSIS — H01001 Unspecified blepharitis right upper eyelid: Secondary | ICD-10-CM

## 2017-02-18 DIAGNOSIS — I10 Essential (primary) hypertension: Secondary | ICD-10-CM

## 2017-02-18 DIAGNOSIS — E785 Hyperlipidemia, unspecified: Secondary | ICD-10-CM

## 2017-02-18 MED ORDER — HYPROMELLOSE 0.4 % OP SOLN: OPHTHALMIC | 0 refills | Status: DC

## 2017-02-18 MED ORDER — CETIRIZINE HCL 10 MG PO CAPS: 10.0000 mg | ORAL_CAPSULE | Freq: Every day | ORAL | 0 refills | Status: DC

## 2017-02-18 NOTE — Progress Notes (Signed)
   [facility] Phone: [phone]   Date of Visit: 02/18/2017   HPI:  Swollen Eyes:  - reports of feeling like her upper eye lids are swollen for the past week or more - she feels that her right eye lid is more swollen compared to left - it seems that every morning they seem more swollen  - denies any discharge, not painful but feels gritty intermittently, has intermittent itchiness as well - reports she went to a nursery and noticed her symptoms worsened when she was around the plants. She then removed all the plants from her home and symptoms did improve a little.  - she does have intermittent runny nose  - has some sneezing but nothing persistent  - intermittent drainage in the back of her throat - she is taking Allegra and is unsure if this is helping with symptoms. She has not tried other allergy medicine  - she has also tried visine for dry eye and "multi" eye drops which does not seem to make much of a difference. - no blurred vision or eye pain, no headaches.    HLD/HTN:  - reports she is fasting today and would like to do lipid panel today if possible.  - she is taking Hyzaar, atenolol, norvasc, and Lovastatin and is compliant  - no headache, chest pain, abdominal pain, or myalgias   ROS: See HPI.  [place]:  PMH: HTN Allergic Rhinitis Asthma DM2 HLD History of Breast Cancer with mastectomy R breast 1999 Littoral Cell Angioma   Hx of Vitamin D Def  Microcytic Anemia  Vertigo  PHYSICAL EXAM: BP 138/88   Pulse 81   Temp 98.5 F (36.9 C) (Oral)   Ht 5\' 3"  (1.6 m)   Wt 184 lb 3.2 oz (83.6 kg)   SpO2 98%   BMI 32.63 kg/m  Gen: NAD, non-toxic appearing.  HEENT: possible mild swelling of the upper eye lids bilaterally, no erythema of eye lids. No eye drainage. no significant conjunctivitis. EMOI, PERRL. Neck supple without lymphadenopathy. Oropharynx normal.  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 I believe this is part of her seasonal allergies. No red flags on exam or history. Discussed return precautions.  - artificial tears - discontinue Allegra as this is not improving symptoms - start Zyrtec 10mg  daily   Hyperlipidemia, unspecified hyperlipidemia type - Lipid panel - Comprehensive metabolic panel  Follow up in 1 month with cbg logs.   Smiley Houseman, MD PGY [place]

## 2017-02-18 NOTE — Patient Instructions (Addendum)
Your eye symptoms are likely allergy related.  You can try warm compresses to the upper eye lids (15 mins about 4 times a day to help with swelling) I sent in a prescription for artifical tears. If this one is expensive, ask the pharmacist for another brand recommendation We will also switch you to Zyrtec (from [facility]) to see if we can get your symptoms under control.   We will get labs today   Follow up in 1 month; make sure you get your mammogram scheduled as well as an eye exam.  Keep a log of your sugars.

## 2017-02-19 ENCOUNTER — Encounter: Payer: Self-pay | Admitting: Internal Medicine

## 2017-02-19 ENCOUNTER — Other Ambulatory Visit: Payer: Self-pay | Admitting: Internal Medicine

## 2017-02-19 LAB — COMPREHENSIVE METABOLIC PANEL
ALT: 19 IU/L (ref 0–32)
AST: 15 IU/L (ref 0–40)
Albumin/Globulin Ratio: 1.6 (ref 1.2–2.2)
Albumin: 4.5 g/dL (ref 3.6–4.8)
Alkaline Phosphatase: 89 IU/L (ref 39–117)
BUN/Creatinine Ratio: 16 (ref 12–28)
BUN: 12 mg/dL (ref 8–27)
Bilirubin Total: 0.3 mg/dL (ref 0.0–1.2)
CALCIUM: 9.7 mg/dL (ref 8.7–10.3)
CO2: 25 mmol/L (ref 20–29)
CREATININE: 0.73 mg/dL (ref 0.57–1.00)
Chloride: 102 mmol/L (ref 96–106)
GFR, EST AFRICAN AMERICAN: 103 mL/min/{1.73_m2} (ref >59)
GFR, EST NON AFRICAN AMERICAN: 89 mL/min/{1.73_m2} (ref >59)
GLUCOSE: 166 mg/dL — AB (ref 65–99)
Globulin, Total: 2.9 g/dL (ref 1.5–4.5)
Potassium: 3.9 mmol/L (ref 3.5–5.2)
Sodium: 143 mmol/L (ref 134–144)
Total Protein: 7.4 g/dL (ref 6.0–8.5)

## 2017-02-19 LAB — LIPID PANEL
CHOL/HDL RATIO: 2.1 ratio (ref 0.0–4.4)
Cholesterol, Total: 136 mg/dL (ref 100–199)
HDL: 64 mg/dL (ref >39)
LDL Calculated: 54 mg/dL (ref 0–99)
TRIGLYCERIDES: 90 mg/dL (ref 0–149)
VLDL Cholesterol Cal: 18 mg/dL (ref 5–40)

## 2017-02-19 NOTE — Progress Notes (Signed)
Sent letter regarding lab results

## 2017-02-21 ENCOUNTER — Encounter: Payer: Self-pay | Admitting: Internal Medicine

## 2017-03-02 ENCOUNTER — Other Ambulatory Visit: Payer: Self-pay | Admitting: Internal Medicine

## 2017-04-08 ENCOUNTER — Other Ambulatory Visit: Payer: Self-pay | Admitting: Internal Medicine

## 2017-05-04 NOTE — Progress Notes (Signed)
   [facility] Phone: [phone]   Date of Visit: 05/05/2017   HPI:  Abdominal Bloating:  - reports that her abdomen feels distended and heavy for the last week or so. No pain - she reports she has been constipated recently. She usually has a bowel movement daily but has only had about 2 in the last week. She describes her stool as  - she has been drinking less water and her diet has also changed in that she has not been follow as healthy of a diet recently. Has not been active as usual - she also had decreased appetite all last week  - she started having some nausea today but is unsure if this is related to her vertigo as she also has symptoms of spinning. She has not tried her meclizine yet.  - no vomiting - has had history of abdominal hysterectomy, tubal ligation, cholecystectomy - no blood in stool  - is due to colonoscopy, last one in 2005.   ROS: See HPI.  [place]:  PMH: HTN Allergic Rhinitis Asthma DM2 HLD History of Breast Cancer with mastectomy R breast 1999 Littoral Cell Angioma   Hx of Vitamin D Def  Vertigo Hx of Nephrolithiasis  PHYSICAL EXAM: BP (!) 150/82   Pulse 96   Temp 99.1 F (37.3 C) (Oral)   Wt 186 lb (84.4 kg)   SpO2 99%   BMI 32.95 kg/m  Gen: NAD, non-toxic HEENT: moist mucous membranes Heart: RRR. No m/r/g Lungs: normal effort, CTAB Abdomen: soft, mild discomfort to palpation diffusely without rebound or guarding, not particularly distended to the examiner. Good bowel sounds   ASSESSMENT/PLAN:  Health maintenance:  - Flu vaccine today  - referral to GI for colonoscopy   Abdominal Bloating: Likely caused by constipation. Some diffuse discomfort to palpation without guarding or rebound. Good bowel sounds. Unlikely infectious. No red flags  - CMP and lipase  - Colace and miralax  - increase PO hydration and fiber in diet.  - return to clinic if symptoms do not improve. If no improvement would likely get x-ray at  least; follow up 3-4 weeks  - GI referral for screening colonoscopy   DM2:  Is due for A1c - A1c resulted after patient left. A1c 7.7. Plan to increase Metformin to 1000 mg BID.  - continue to check cbgs   Smiley Houseman, MD PGY [place]

## 2017-05-05 ENCOUNTER — Ambulatory Visit (INDEPENDENT_AMBULATORY_CARE_PROVIDER_SITE_OTHER): Payer: BC Managed Care – PPO | Admitting: Internal Medicine

## 2017-05-05 ENCOUNTER — Encounter: Payer: Self-pay | Admitting: Internal Medicine

## 2017-05-05 VITALS — BP 150/82 | HR 96 | Temp 99.1°F | Wt 186.0 lb

## 2017-05-05 DIAGNOSIS — Z23 Encounter for immunization: Secondary | ICD-10-CM

## 2017-05-05 DIAGNOSIS — R140 Abdominal distension (gaseous): Secondary | ICD-10-CM | POA: Diagnosis not present

## 2017-05-05 DIAGNOSIS — E119 Type 2 diabetes mellitus without complications: Secondary | ICD-10-CM | POA: Diagnosis not present

## 2017-05-05 DIAGNOSIS — Z1211 Encounter for screening for malignant neoplasm of colon: Secondary | ICD-10-CM | POA: Diagnosis not present

## 2017-05-05 LAB — POCT GLYCOSYLATED HEMOGLOBIN (HGB A1C): HEMOGLOBIN A1C: 7.7

## 2017-05-05 MED ORDER — DOCUSATE SODIUM 100 MG PO CAPS: 100.0000 mg | ORAL_CAPSULE | Freq: Two times a day (BID) | ORAL | 0 refills | Status: DC

## 2017-05-05 MED ORDER — POLYETHYLENE GLYCOL 3350 17 G PO PACK: 17.0000 g | PACK | Freq: Every day | ORAL | 0 refills | Status: DC

## 2017-05-05 NOTE — Patient Instructions (Addendum)
I think your symptoms are due to constipation. Make sure you increase your water intake and increase your fiber intake.  I prescribed you Colace to take twice a day for stool softner I also prescribed you Miralax you can start with once daily and increase to twice a day so that you have soft formed stool daily.   You can try Meclizine for your dizziness   I made a referral to GI for your colonoscopy  Please follow up in about 3-4 weeks to see how things are going

## 2017-05-06 LAB — LIPASE: LIPASE: 33 U/L (ref 14–72)

## 2017-05-06 LAB — CMP14+EGFR
ALT: 21 IU/L (ref 0–32)
AST: 19 IU/L (ref 0–40)
Albumin/Globulin Ratio: 1.5 (ref 1.2–2.2)
Albumin: 4.6 g/dL (ref 3.6–4.8)
Alkaline Phosphatase: 86 IU/L (ref 39–117)
BUN/Creatinine Ratio: 19 (ref 12–28)
BUN: 12 mg/dL (ref 8–27)
Bilirubin Total: 0.3 mg/dL (ref 0.0–1.2)
CALCIUM: 9.8 mg/dL (ref 8.7–10.3)
CO2: 23 mmol/L (ref 20–29)
CREATININE: 0.64 mg/dL (ref 0.57–1.00)
Chloride: 99 mmol/L (ref 96–106)
GFR calc Af Amer: 111 mL/min/{1.73_m2} (ref >59)
GFR, EST NON AFRICAN AMERICAN: 96 mL/min/{1.73_m2} (ref >59)
Globulin, Total: 3.1 g/dL (ref 1.5–4.5)
Glucose: 203 mg/dL — ABNORMAL HIGH (ref 65–99)
Potassium: 3.5 mmol/L (ref 3.5–5.2)
Sodium: 140 mmol/L (ref 134–144)
Total Protein: 7.7 g/dL (ref 6.0–8.5)

## 2017-05-06 LAB — CBC
Hematocrit: 35.8 % (ref 34.0–46.6)
Hemoglobin: 11.8 g/dL (ref 11.1–15.9)
MCH: 26.3 pg — ABNORMAL LOW (ref 26.6–33.0)
MCHC: 33 g/dL (ref 31.5–35.7)
MCV: 80 fL (ref 79–97)
PLATELETS: 212 10*3/uL (ref 150–379)
RBC: 4.48 x10E6/uL (ref 3.77–5.28)
RDW: 16 % — ABNORMAL HIGH (ref 12.3–15.4)
WBC: 6.2 10*3/uL (ref 3.4–10.8)

## 2017-05-07 ENCOUNTER — Telehealth: Payer: Self-pay

## 2017-05-07 NOTE — Telephone Encounter (Signed)
-----   Message from Smiley Houseman, MD sent at 05/06/2017  7:56 PM EDT ----- Please inform patient that her labs that were obtained at the last visit were unremarkable except her A1c increased to 7.7 from 7. Our goal is to be at or below 7. Please ask her to increase Metformin from 500mg  BID to 1000mg  BID. Additionally, please ask her to make a nurse visit some time next week for BP check. Thank you

## 2017-05-07 NOTE — Telephone Encounter (Signed)
Called pt to inform of lab results and need for BP check. Pt did now answer, although her husband did, he was informed of all. Pts husband said he will have her call to schedule a nurse visit for BP check.

## 2017-05-12 ENCOUNTER — Encounter: Payer: Self-pay | Admitting: Internal Medicine

## 2017-05-14 ENCOUNTER — Other Ambulatory Visit: Payer: Self-pay | Admitting: Internal Medicine

## 2017-05-14 MED ORDER — ATENOLOL 50 MG PO TABS: 50.0000 mg | ORAL_TABLET | Freq: Every day | ORAL | 0 refills | Status: DC

## 2017-05-17 ENCOUNTER — Other Ambulatory Visit: Payer: Self-pay | Admitting: Internal Medicine

## 2017-06-07 ENCOUNTER — Encounter: Payer: Self-pay | Admitting: Internal Medicine

## 2017-06-21 ENCOUNTER — Other Ambulatory Visit: Payer: Self-pay | Admitting: Internal Medicine

## 2017-08-11 ENCOUNTER — Other Ambulatory Visit: Payer: Self-pay | Admitting: Internal Medicine

## 2017-08-11 MED ORDER — AMLODIPINE BESYLATE 5 MG PO TABS: 5.0000 mg | ORAL_TABLET | Freq: Every day | ORAL | 3 refills | Status: DC

## 2017-08-11 NOTE — Progress Notes (Signed)
Refilled norvasc

## 2017-09-20 ENCOUNTER — Other Ambulatory Visit: Payer: Self-pay | Admitting: *Deleted

## 2017-09-20 MED ORDER — ATENOLOL 50 MG PO TABS: 50.0000 mg | ORAL_TABLET | Freq: Every day | ORAL | 1 refills | Status: DC

## 2017-09-28 ENCOUNTER — Other Ambulatory Visit: Payer: Self-pay | Admitting: Internal Medicine

## 2017-10-04 NOTE — Addendum Note (Signed)
Addended by: Dorna Bloom on: 10/04/2017 03:32 PM   Modules accepted: Orders

## 2017-10-04 NOTE — Telephone Encounter (Signed)
Pharmacy sent fax requesting a 90 day supply for the pended medication. Please advise.

## 2017-10-26 ENCOUNTER — Other Ambulatory Visit: Payer: Self-pay | Admitting: Internal Medicine

## 2017-11-03 ENCOUNTER — Other Ambulatory Visit: Payer: Self-pay | Admitting: Internal Medicine

## 2017-11-03 NOTE — Telephone Encounter (Signed)
Please have patient come in for a visit. 

## 2017-11-03 NOTE — Telephone Encounter (Signed)
LM with husband to have patient call back and make an appt for her blood pressure. Shirel Mallis,CMA

## 2017-11-23 ENCOUNTER — Ambulatory Visit: Payer: BC Managed Care – PPO | Admitting: Internal Medicine

## 2017-11-23 ENCOUNTER — Other Ambulatory Visit: Payer: Self-pay

## 2017-11-23 ENCOUNTER — Encounter: Payer: Self-pay | Admitting: Internal Medicine

## 2017-11-23 VITALS — BP 142/80 | HR 76 | Temp 98.2°F | Wt 183.0 lb

## 2017-11-23 DIAGNOSIS — E785 Hyperlipidemia, unspecified: Secondary | ICD-10-CM

## 2017-11-23 DIAGNOSIS — I10 Essential (primary) hypertension: Secondary | ICD-10-CM

## 2017-11-23 DIAGNOSIS — Z8639 Personal history of other endocrine, nutritional and metabolic disease: Secondary | ICD-10-CM | POA: Diagnosis not present

## 2017-11-23 DIAGNOSIS — E119 Type 2 diabetes mellitus without complications: Secondary | ICD-10-CM | POA: Diagnosis not present

## 2017-11-23 DIAGNOSIS — Z1231 Encounter for screening mammogram for malignant neoplasm of breast: Secondary | ICD-10-CM

## 2017-11-23 DIAGNOSIS — Z1239 Encounter for other screening for malignant neoplasm of breast: Secondary | ICD-10-CM

## 2017-11-23 DIAGNOSIS — E559 Vitamin D deficiency, unspecified: Secondary | ICD-10-CM

## 2017-11-23 LAB — POCT GLYCOSYLATED HEMOGLOBIN (HGB A1C): HEMOGLOBIN A1C: 7.2

## 2017-11-23 MED ORDER — GLUCOSE BLOOD VI STRP: ORAL_STRIP | 12 refills | Status: DC

## 2017-11-23 MED ORDER — METFORMIN HCL 500 MG PO TABS: 500.0000 mg | ORAL_TABLET | Freq: Two times a day (BID) | ORAL | 3 refills | Status: DC

## 2017-11-23 MED ORDER — ATENOLOL 50 MG PO TABS
50.0000 mg | ORAL_TABLET | Freq: Every day | ORAL | 1 refills | Status: DC
Start: 2017-11-23 — End: 2018-03-30

## 2017-11-23 MED ORDER — LOVASTATIN 20 MG PO TABS: ORAL_TABLET | ORAL | 3 refills | Status: DC

## 2017-11-23 MED ORDER — ASPIRIN 81 MG PO TABS: 81.0000 mg | ORAL_TABLET | Freq: Every day | ORAL | 3 refills | Status: DC

## 2017-11-23 MED ORDER — AMLODIPINE BESYLATE 5 MG PO TABS: 5.0000 mg | ORAL_TABLET | Freq: Every day | ORAL | 3 refills | Status: DC

## 2017-11-23 MED ORDER — ONETOUCH DELICA LANCETS 33G MISC: 12 refills | Status: DC

## 2017-11-23 NOTE — Assessment & Plan Note (Signed)
Continue statin. Lipid will be due 01/2018.

## 2017-11-23 NOTE — Progress Notes (Signed)
   [facility] Phone: [phone]   Date of Visit: 11/23/2017   HPI:  HTN: - taking medications daily: Norvasc 5mg  daily  - does not exercise regularly  - denies chest pain, HA, visual disturbances, LE swelling  DM2:  - Metformin 500mg  BID  - has not been able to check sugars as her strips has expired.  - no polyuria or polydypsia - usually follows a strict diet but not as much this past week due to easter  - she has not been physical active as she used to be.   HLD: - taking medications daily: Lovastatin 20mg  daily  - no Abdominal pain or myalgias   ROS: See HPI.  [place]:  PMH: Asthma HTN Allergic Rhinitis DM2 History of Breast Cancer  History of Vitamin D Deficiency  HLD  PHYSICAL EXAM: BP (!) 142/80   Pulse 76   Temp 98.2 F (36.8 C) (Oral)   Wt 183 lb (83 kg)   SpO2 99%   BMI 32.42 kg/m  GEN: NAD CV: RRR, no murmurs, rubs, or gallops PULM: CTAB, normal effort ABD: Soft, nontender, nondistended, NABS, no organomegaly SKIN: No rash or cyanosis; warm and well-perfused EXTR: No lower extremity edema or calf tenderness PSYCH: Mood and affect euthymic, normal rate and volume of speech NEURO: Awake, alert, no focal deficits grossly, normal speech  Diabetic Foot Exam - Simple   Simple Foot Form Visual Inspection No deformities, no ulcerations, no other skin breakdown bilaterally:  Yes Sensation Testing Intact to touch and monofilament testing bilaterally:  Yes Pulse Check Posterior Tibialis and Dorsalis pulse intact bilaterally:  Yes Comments    ] ASSESSMENT/PLAN:  Health maintenance:  - reminded patient to get mammogram done  - reminded patient and provided contact information to schedule a colonoscopy. Referral has not expired   Essential hypertension Improved from last visit. Continue Norvasc. Encouraged increasing physical activity and continued diet modifications.   Controlled type 2 diabetes mellitus without  complication Improved from 7.7 to 7.2. WIll keep Metformin 500mg  BID. Encouraged lifestyle modifications. Follow up in 3 months for recheck. BMP today.   H/O vitamin D deficiency Recheck vitamin D level   Hyperlipidemia with target LDL less than 100 Continue statin. Lipid will be due 01/2018.   Smiley Houseman, MD PGY [place]

## 2017-11-23 NOTE — Assessment & Plan Note (Addendum)
Improved from 7.7 to 7.2. WIll keep Metformin 500mg  BID. Encouraged lifestyle modifications. Follow up in 3 months for recheck. BMP today.

## 2017-11-23 NOTE — Assessment & Plan Note (Signed)
Recheck vitamin D level 

## 2017-11-23 NOTE — Assessment & Plan Note (Signed)
Improved from last visit. Continue Norvasc. Encouraged increasing physical activity and continued diet modifications.

## 2017-11-23 NOTE — Patient Instructions (Addendum)
1) Please call to schedule your colonoscopy: [facility]  Address: [address], [place], [place] [address]  Phone: [phone]  2) Your A1c did improve a little from 7.7 to 7.2  3) please make a lab visit to get your blood drawn.   4) Please make a appointment for mammogram   5) Please try to work on diabetes.

## 2017-12-08 ENCOUNTER — Other Ambulatory Visit: Payer: Self-pay | Admitting: Internal Medicine

## 2017-12-30 ENCOUNTER — Ambulatory Visit (HOSPITAL_COMMUNITY)
Admission: EM | Admit: 2017-12-30 | Discharge: 2017-12-30 | Disposition: A | Discharge status: Home / Self Care | Payer: BC Managed Care – PPO | Attending: Family Medicine | Admitting: Family Medicine

## 2017-12-30 ENCOUNTER — Encounter (HOSPITAL_COMMUNITY): Payer: Self-pay | Admitting: Family Medicine

## 2017-12-30 DIAGNOSIS — M25519 Pain in unspecified shoulder: Secondary | ICD-10-CM

## 2017-12-30 DIAGNOSIS — R110 Nausea: Secondary | ICD-10-CM | POA: Diagnosis not present

## 2017-12-30 DIAGNOSIS — M542 Cervicalgia: Secondary | ICD-10-CM | POA: Diagnosis not present

## 2017-12-30 MED ORDER — MELOXICAM 7.5 MG PO TABS: 7.5000 mg | ORAL_TABLET | Freq: Every day | ORAL | 0 refills | Status: DC

## 2017-12-30 MED ORDER — CYCLOBENZAPRINE HCL 5 MG PO TABS: 2.5000 mg | ORAL_TABLET | Freq: Every evening | ORAL | 0 refills | Status: DC | PRN
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
## 2018-01-04 ENCOUNTER — Other Ambulatory Visit: Payer: Self-pay

## 2018-01-04 ENCOUNTER — Encounter: Payer: Self-pay | Admitting: Family Medicine

## 2018-01-04 ENCOUNTER — Ambulatory Visit: Payer: BC Managed Care – PPO | Admitting: Family Medicine

## 2018-01-04 DIAGNOSIS — M549 Dorsalgia, unspecified: Secondary | ICD-10-CM | POA: Diagnosis not present

## 2018-01-04 HISTORY — DX: Dorsalgia, unspecified: M54.9
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
## 2018-01-11 ENCOUNTER — Encounter: Payer: Self-pay | Admitting: Family Medicine

## 2018-01-13 ENCOUNTER — Encounter: Payer: Self-pay | Admitting: Family Medicine
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
## 2018-01-18 ENCOUNTER — Encounter: Payer: Self-pay | Admitting: Internal Medicine

## 2018-01-18 ENCOUNTER — Ambulatory Visit: Payer: BC Managed Care – PPO | Admitting: Internal Medicine

## 2018-01-18 ENCOUNTER — Other Ambulatory Visit: Payer: Self-pay

## 2018-01-18 VITALS — BP 138/78 | HR 97 | Temp 98.4°F | Wt 184.0 lb

## 2018-01-18 DIAGNOSIS — M545 Low back pain, unspecified: Secondary | ICD-10-CM

## 2018-01-18 DIAGNOSIS — M25512 Pain in left shoulder: Secondary | ICD-10-CM | POA: Diagnosis not present

## 2018-01-18 DIAGNOSIS — J309 Allergic rhinitis, unspecified: Secondary | ICD-10-CM | POA: Diagnosis not present

## 2018-01-18 MED ORDER — ALBUTEROL SULFATE HFA 108 (90 BASE) MCG/ACT IN AERS: INHALATION_SPRAY | RESPIRATORY_TRACT | 1 refills | Status: DC

## 2018-01-18 NOTE — Patient Instructions (Signed)
I made a referral to physical therapy Take Mobic in the morning  Take Flexeril at night time.  We ordered a back xray.

## 2018-01-19 ENCOUNTER — Encounter: Payer: Self-pay | Admitting: Internal Medicine

## 2018-01-19 DIAGNOSIS — M542 Cervicalgia: Secondary | ICD-10-CM

## 2018-01-21 ENCOUNTER — Ambulatory Visit
Admission: RE | Admit: 2018-01-21 | Discharge: 2018-01-21 | Disposition: A | Discharge status: Home / Self Care | Payer: BC Managed Care – PPO | Source: Ambulatory Visit | Attending: Family Medicine | Admitting: Family Medicine

## 2018-01-21 DIAGNOSIS — M545 Low back pain, unspecified: Secondary | ICD-10-CM

## 2018-01-21 DIAGNOSIS — M542 Cervicalgia: Secondary | ICD-10-CM

## 2018-01-24 ENCOUNTER — Encounter: Payer: Self-pay | Admitting: Internal Medicine

## 2018-01-24 MED ORDER — CYCLOBENZAPRINE HCL 5 MG PO TABS: 2.5000 mg | ORAL_TABLET | Freq: Every evening | ORAL | 0 refills | Status: DC | PRN

## 2018-01-25 ENCOUNTER — Ambulatory Visit: Payer: BC Managed Care – PPO | Attending: Family Medicine | Admitting: Physical Therapy

## 2018-01-25 ENCOUNTER — Encounter: Payer: Self-pay | Admitting: Internal Medicine

## 2018-01-25 ENCOUNTER — Encounter: Payer: Self-pay | Admitting: Physical Therapy

## 2018-01-25 ENCOUNTER — Other Ambulatory Visit: Payer: Self-pay

## 2018-01-25 DIAGNOSIS — M542 Cervicalgia: Secondary | ICD-10-CM | POA: Insufficient documentation

## 2018-01-25 DIAGNOSIS — M62830 Muscle spasm of back: Secondary | ICD-10-CM | POA: Diagnosis not present

## 2018-01-25 DIAGNOSIS — R293 Abnormal posture: Secondary | ICD-10-CM | POA: Insufficient documentation
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
## 2018-01-25 NOTE — Patient Instructions (Signed)
Trigger Point Dry Needling  . What is Trigger Point Dry Needling (DN)? o DN is a physical therapy technique used to treat muscle pain and dysfunction. Specifically, DN helps deactivate muscle trigger points (muscle knots).  o A thin filiform needle is used to penetrate the skin and stimulate the underlying trigger point. The goal is for a local twitch response (LTR) to occur and for the trigger point to relax. No medication of any kind is injected during the procedure.   . What Does Trigger Point Dry Needling Feel Like?  o The procedure feels different for each individual patient. Some patients report that they do not actually feel the needle enter the skin and overall the process is not painful. Very mild bleeding may occur. However, many patients feel a deep cramping in the muscle in which the needle was inserted. This is the local twitch response.   Marland Kitchen How Will I feel after the treatment? o Soreness is normal, and the onset of soreness may not occur for a few hours. Typically this soreness does not last longer than two days.  o Bruising is uncommon, however; ice can be used to decrease any possible bruising.  o In rare cases feeling tired or nauseous after the treatment is normal. In addition, your symptoms may get worse before they get better, this period will typically not last longer than 24 hours.   . What Can I do After My Treatment? o Increase your hydration by drinking more water for the next 24 hours. o You may place ice or heat on the areas treated that have become sore, however, do not use heat on inflamed or bruised areas. Heat often brings more relief post needling. o You can continue your regular activities, but vigorous activity is not recommended initially after the treatment for 24 hours. o DN is best combined with other physical therapy such as strengthening, stretching, and other therapies.   Access Code: [email or web address]  URL: [email or web address]  Date: 01/25/2018   Prepared by: Elly Modena   Exercises  Supine Cervical Rotation AROM on Pillow - 10 reps - 2x daily - 7x weekly  Supine Cervical Sidebending - 10 reps - 2x daily - 7x weekly    [facility] [address], [place], [place] [address] Phone # [phone] Fax [phone]

## 2018-01-27 ENCOUNTER — Ambulatory Visit: Payer: BC Managed Care – PPO

## 2018-01-27 DIAGNOSIS — M62830 Muscle spasm of back: Secondary | ICD-10-CM

## 2018-01-27 DIAGNOSIS — M542 Cervicalgia: Secondary | ICD-10-CM

## 2018-01-27 DIAGNOSIS — R293 Abnormal posture: Secondary | ICD-10-CM
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
## 2018-02-01 ENCOUNTER — Ambulatory Visit: Payer: BC Managed Care – PPO | Admitting: Physical Therapy

## 2018-02-02 ENCOUNTER — Ambulatory Visit: Payer: BC Managed Care – PPO | Attending: Family Medicine

## 2018-02-02 DIAGNOSIS — M542 Cervicalgia: Secondary | ICD-10-CM | POA: Diagnosis present

## 2018-02-02 DIAGNOSIS — R293 Abnormal posture: Secondary | ICD-10-CM | POA: Diagnosis present

## 2018-02-02 DIAGNOSIS — M62830 Muscle spasm of back: Secondary | ICD-10-CM

## 2018-02-02 NOTE — Therapy (Signed)
[facility] [address] [place], [place], [address] Phone: [phone]   Fax:  [phone]  Physical Therapy Treatment  Patient Details  Name: Erica Campbell MRN: [id number] Date of Birth: 07/19/1956 Referring Provider: Tanna Furry, MD   Encounter Date: 02/02/2018  PT End of Session - 02/02/18 1228    Visit Number  3    Date for PT Re-Evaluation  02/24/18    Authorization Type  BCBS    Authorization Time Period  01/25/18 to 02/24/18    PT Start Time  1151    PT Stop Time  1240    PT Time Calculation (min)  49 min    Activity Tolerance  Patient tolerated treatment well    Behavior During Therapy  [facility] for tasks assessed/performed       Past Medical History:  Diagnosis Date  . Allergic rhinitis   . Anemia    only in past, not current problem  . Asthma    since 1978  . Breast cancer ([place])    1999 s/p mastectomy right breast only.   . Chronic pain    pelvic  . Diabetes mellitus    2010  . H/O mastectomy   . HTN (hypertension)    1993  . Kidney stone    2012    Past Surgical History:  Procedure Laterality Date  . BREAST SURGERY    . CESAREAN SECTION     x3  . CHOLECYSTECTOMY     2007-chronic cholecystitis with cholelithiasis  . h/o mastectomy    . TOTAL ABDOMINAL HYSTERECTOMY     with cervix left only.   . TUBAL LIGATION     1981    There were no vitals filed for this visit.  Subjective Assessment - 02/02/18 1152    Subjective  I am stiff, no pain.      Pertinent History  history of breast cancer, HTN    Patient Stated Goals  improve pain and allow her stay at her desk without needing medication    Currently in Pain?  Yes    Pain Score  0-No pain    Pain Location  Back    Pain Orientation  Left;Mid;Upper    Pain Descriptors / Indicators  Aching;Tightness    Pain Type  Acute pain    Pain Onset  More than a month ago    Pain Frequency  Intermittent    Aggravating Factors   sitting for  too long, rental car seat, turning head    Pain Relieving Factors  heat, medication, rest                       [facility] Adult PT Treatment/Exercise - 02/02/18 0001      Neck Exercises: Seated   Other Seated Exercise  seated thoracocervical rotation 3x20 seconds      Lumbar Exercises: Stretches   Lower Trunk Rotation  3 reps;20 seconds      Lumbar Exercises: Aerobic   Nustep  Level 1 x 8 minutes Lt shoulder pain so stopped use of Lt Ue with this      Lumbar Exercises: Supine   Straight Leg Raise  10 reps Rt leg x 10, Lt leg x 6      Shoulder Exercises: Supine   Horizontal ABduction  Strengthening;Both;20 reps    External Rotation  Strengthening;Both;20 reps      Electrical Stimulation   Electrical Stimulation Location  neck (Lt) and low back  bil.     Electrical Stimulation Action  IFC    Electrical Stimulation Parameters  15 minutes    Electrical Stimulation Goals  Pain             PT Education - 02/02/18 1220    Education Details   Access Code: [email or web address]     Person(s) Educated  Patient    Methods  Explanation;Handout    Comprehension  Verbalized understanding;Returned demonstration       PT Short Term Goals - 01/25/18 1244      PT SHORT TERM GOAL #1   Title  Pt will demo consistency and independence with her initial HEP to decrease pain and improve body mechanics.     Time  2    Period  Weeks    Status  New    Target Date  02/08/18      PT SHORT TERM GOAL #2   Title  Pt will demo proper set up and use of the lumbar roll for additional support and improved spinal alignment throughout the day.    Time  2    Period  Weeks    Status  New        PT Long Term Goals - 01/25/18 1245      PT LONG TERM GOAL #1   Title  Pt will demo improved BUE strength to 5/5 MMT which will reflect decrease in pain with resistance.    Time  4    Period  Weeks    Target Date  02/24/18      PT LONG TERM GOAL #2   Title  Pt will demo improved cervical active ROM  to atlesat 60 deg each direction without pain, which will allow her to check blindspots while driving.     Time  4    Period  Weeks    Status  New      PT LONG TERM GOAL #3   Title  Pt will report atleast 75% improvement in her neck/shoulder pain from the start of therapy which will allow her to complete work activity without the need for medication.    Time  4    Period  Weeks    Status  New      PT LONG TERM GOAL #4   Title  Pt will demo improved posture awareness, evident by her ability to self correct posture throughout her session and without cuing from the therapist.    Time  4    Period  Weeks    Status  New            Plan - 02/02/18 1206    Clinical Impression Statement  Pt reports that she is improving overall.  Pt rates overall improvement as 70%.  Pt tolerated advancement of HEP today to include postural strength and spinal mobility.  Pt is making postural corrections at home and work.  Pt required minor verbal cues and assessment during exercise for technique.  Pt will continue to benefit from skilled PT for flexibility, strength, manual and modalities as needed for return to prior level of function.     Rehab Potential  Good    PT Frequency  2x / week    PT Duration  4 weeks    PT Treatment/Interventions  ADLs/Self Care Home Management;Moist Heat;Electrical Stimulation;Therapeutic activities;Neuromuscular re-education;Manual techniques;Dry needling;Passive range of motion;Patient/family education;Taping    PT Next Visit Plan  review new HEP.  continue to advance HEP for strength, flexibility and address pain/stiffness  as needed.      PT Home Exercise Plan  [email or web address]     Recommended Other Services  initial evaluation is signed       Patient will benefit from skilled therapeutic intervention in order to improve the following deficits and impairments:  Decreased activity tolerance, Impaired flexibility, Decreased strength, Hypomobility, Decreased range of motion,  Postural dysfunction, Increased muscle spasms, Pain, Improper body mechanics  Visit Diagnosis: Muscle spasm of back  Cervicalgia  Abnormal posture     Problem List Patient Active Problem List   Diagnosis Date Noted  . Back pain 01/04/2018  . Ruptured right breast implant 04/14/2016  . Vertigo 03/04/2016  . Healthcare maintenance 12/22/2015  . Transient adjustment reaction with anxiety 02/08/2015  . Dizziness 12/12/2014  . 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 H/O vitamin D deficiency 11/23/2012  . Hyperlipidemia with target LDL less than 100 11/23/2012  . Allergic rhinitis 11/22/2012  . Sinusitis, chronic 06/17/2012  . History of breast cancer   . Groin pain, left lower quadrant 01/27/2012  . Asthma   . Controlled type 2 diabetes mellitus without complication ([place])   . Essential hypertension      Sigurd Sos, PT 02/02/18 12:33 PM  [facility] [address] [place], [place], [address] Phone: [phone]   Fax:  [phone]  Name: Jasmine Mcbeth MRN: [id number] Date of Birth: Apr 15, 1956

## 2018-02-02 NOTE — Patient Instructions (Addendum)
Cervico-Thoracic: Extension / Rotation (Sitting)    Reach across body with left arm and grasp back of chair. Gently look over right side shoulder. Hold __20__ seconds. Relax. Repeat __3__ times per set. Do _1___ sets per session. Do __3__ sessions per day.  http://orth.exer.us/981   Copyright  VHI. All rights reserved.  Access Code: [email or web address]  URL: [email or web address]  Date: 02/02/2018  Prepared by: Sigurd Sos   Exercises   Supine Shoulder Horizontal Abduction with Resistance - 10 reps - 2 sets - 2x daily - 7x weekly  Supine Bilateral Shoulder External Rotation with Resistance - 10 reps - 2 sets - 2x daily - 7x weekly  Supine Lower Trunk Rotation - 3 reps - 1 sets - 20 hold - 2x daily - 7x weekly  Supine Active Straight Leg Raise - 10 reps - 2 sets - 2x daily - 7x weekly  Patient Education  Office Posture

## 2018-02-07 ENCOUNTER — Encounter: Payer: Self-pay | Admitting: Family Medicine

## 2018-02-08 ENCOUNTER — Ambulatory Visit: Payer: BC Managed Care – PPO

## 2018-02-08 DIAGNOSIS — M542 Cervicalgia: Secondary | ICD-10-CM

## 2018-02-08 DIAGNOSIS — M62830 Muscle spasm of back: Secondary | ICD-10-CM | POA: Diagnosis not present

## 2018-02-08 DIAGNOSIS — R293 Abnormal posture: Secondary | ICD-10-CM

## 2018-02-08 NOTE — Therapy (Signed)
[facility] [address] [place], [place], [address] Phone: [phone]   Fax:  [phone]  Physical Therapy Treatment  Patient Details  Name: Erica Campbell MRN: [id number] Date of Birth: 1955-10-12 Referring Provider: Tanna Furry, MD   Encounter Date: 02/08/2018  PT End of Session - 02/08/18 0931    Visit Number  4    Date for PT Re-Evaluation  02/24/18    Authorization Type  BCBS    Authorization Time Period  01/25/18 to 02/24/18    PT Start Time  0905 late    PT Stop Time  0946    PT Time Calculation (min)  41 min    Behavior During Therapy  [facility] for tasks assessed/performed       Past Medical History:  Diagnosis Date  . Allergic rhinitis   . Anemia    only in past, not current problem  . Asthma    since 1978  . Breast cancer ([place])    1999 s/p mastectomy right breast only.   . Chronic pain    pelvic  . Diabetes mellitus    2010  . H/O mastectomy   . HTN (hypertension)    1993  . Kidney stone    2012    Past Surgical History:  Procedure Laterality Date  . BREAST SURGERY    . CESAREAN SECTION     x3  . CHOLECYSTECTOMY     2007-chronic cholecystitis with cholelithiasis  . h/o mastectomy    . TOTAL ABDOMINAL HYSTERECTOMY     with cervix left only.   . TUBAL LIGATION     1981    There were no vitals filed for this visit.  Subjective Assessment - 02/08/18 0905    Subjective  I was very active with having company over the weekend and I had to take a muscle relaxer.      Patient Stated Goals  improve pain and allow her stay at her desk without needing medication    Currently in Pain?  Yes    Pain Score  3     Pain Location  Back    Pain Orientation  Mid;Upper    Pain Descriptors / Indicators  Aching;Tightness    Pain Onset  More than a month ago    Pain Frequency  Intermittent    Aggravating Factors   sitting for too long, rental car seat, turning head    Pain Relieving Factors  heat,  medication, rest                       [facility] Adult PT Treatment/Exercise - 02/08/18 0001      Neck Exercises: Seated   Other Seated Exercise  seated thoracocervical rotation 3x20 seconds      Lumbar Exercises: Stretches   Active Hamstring Stretch  Left;Right;3 reps;20 seconds    Lower Trunk Rotation  3 reps;20 seconds      Lumbar Exercises: Aerobic   Nustep  Level 1 x 8 minutes Lt shoulder pain so stopped use of Lt Ue with this      Modalities   Modalities  Moist Heat      Moist Heat Therapy   Number Minutes Moist Heat  15 Minutes    Moist Heat Location  Lumbar Spine;Cervical      Electrical Stimulation   Electrical Stimulation Location  Bil scapula and bil neck    Electrical Stimulation Action  IFC    Electrical Stimulation Parameters  15 minutes    Electrical Stimulation Goals  Pain               PT Short Term Goals - 01/25/18 1244      PT SHORT TERM GOAL #1   Title  Pt will demo consistency and independence with her initial HEP to decrease pain and improve body mechanics.     Time  2    Period  Weeks    Status  New    Target Date  02/08/18      PT SHORT TERM GOAL #2   Title  Pt will demo proper set up and use of the lumbar roll for additional support and improved spinal alignment throughout the day.    Time  2    Period  Weeks    Status  New        PT Long Term Goals - 01/25/18 1245      PT LONG TERM GOAL #1   Title  Pt will demo improved BUE strength to 5/5 MMT which will reflect decrease in pain with resistance.    Time  4    Period  Weeks    Target Date  02/24/18      PT LONG TERM GOAL #2   Title  Pt will demo improved cervical active ROM to atlesat 60 deg each direction without pain, which will allow her to check blindspots while driving.     Time  4    Period  Weeks    Status  New      PT LONG TERM GOAL #3   Title  Pt will report atleast 75% improvement in her neck/shoulder pain from the start of therapy which will allow  her to complete work activity without the need for medication.    Time  4    Period  Weeks    Status  New      PT LONG TERM GOAL #4   Title  Pt will demo improved posture awareness, evident by her ability to self correct posture throughout her session and without cuing from the therapist.    Time  4    Period  Weeks    Status  New            Plan - 02/08/18 0908    Clinical Impression Statement  Pt had a flare-up of pain over the past few days due to being busy with family in town.  Pt reports minimal compliance with HEP and PT encouraged her to become more consistent to reduce stiffness.  Pt demonstrates slow mobility with exercise in the clinic due to guarding.  Pt requires verbal cues to reduce substitution.  Pt will continue to benefit from skilled PT for flexibility, strength, manual and modaliteis as needed.     Rehab Potential  Good    PT Frequency  2x / week    PT Duration  4 weeks    PT Treatment/Interventions  ADLs/Self Care Home Management;Moist Heat;Electrical Stimulation;Therapeutic activities;Neuromuscular re-education;Manual techniques;Dry needling;Passive range of motion;Patient/family education;Taping    PT Next Visit Plan  continue to advance HEP for strength, flexibility and address pain/stiffness as needed.      PT Home Exercise Plan  [email or web address]     Consulted and Agree with Plan of Care  Patient       Patient will benefit from skilled therapeutic intervention in order to improve the following deficits and impairments:  Decreased activity tolerance, Impaired flexibility, Decreased strength, Hypomobility, Decreased range of motion,  Postural dysfunction, Increased muscle spasms, Pain, Improper body mechanics  Visit Diagnosis: Muscle spasm of back  Cervicalgia  Abnormal posture     Problem List Patient Active Problem List   Diagnosis Date Noted  . Back pain 01/04/2018  . Ruptured right breast implant 04/14/2016  . Vertigo 03/04/2016  . Healthcare  maintenance 12/22/2015  . Transient adjustment reaction with anxiety 02/08/2015  . Dizziness 12/12/2014  . 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 H/O vitamin D deficiency 11/23/2012  . Hyperlipidemia with target LDL less than 100 11/23/2012  . Allergic rhinitis 11/22/2012  . Sinusitis, chronic 06/17/2012  . History of breast cancer   . Groin pain, left lower quadrant 01/27/2012  . Asthma   . Controlled type 2 diabetes mellitus without complication ([place])   . Essential hypertension      Sigurd Sos, PT 02/08/18 9:33 AM  [facility] [address], [place], [place], [address] Phone: [phone]   Fax:  [phone]  Name: Erica Campbell MRN: [id number] Date of Birth: 1956/01/31

## 2018-02-10 ENCOUNTER — Encounter: Payer: Self-pay | Admitting: Physical Therapy

## 2018-02-10 ENCOUNTER — Ambulatory Visit: Payer: BC Managed Care – PPO | Admitting: Physical Therapy

## 2018-02-10 DIAGNOSIS — M542 Cervicalgia: Secondary | ICD-10-CM

## 2018-02-10 DIAGNOSIS — M62830 Muscle spasm of back: Secondary | ICD-10-CM | POA: Diagnosis not present

## 2018-02-10 DIAGNOSIS — R293 Abnormal posture: Secondary | ICD-10-CM

## 2018-02-10 NOTE — Patient Instructions (Signed)
Access Code: [email or web address]  URL: [email or web address]  Date: 02/10/2018  Prepared by: Elly Modena   Exercises  Seated Hamstring Stretch - 3 reps - 20 hold - 3x daily - 7x weekly  Seated Piriformis Stretch - 3 reps - 1 sets - 20 hold - 3x daily - 7x weekly  Standing Cervical Rotation AROM with Overpressure - 10 reps - 3 sets - 1x daily - 7x weekly  Seated Trunk Rotation Stretch - 10 reps - 3 sets - 1x daily - 7x weekly  Seated Shoulder Horizontal Abduction with Resistance - 10 reps - 3 sets - 1x daily - 7x weekly  Standing Shoulder External Rotation with Resistance - 10 reps - 3 sets - 1x daily - 7x weekly  Patient Education  Office Posture    [facility] [address] [place], [place] [address] Phone # [phone] Fax [phone]

## 2018-02-10 NOTE — Therapy (Signed)
[facility] [address], [place], [place], [address] Phone: [phone]   Fax:  [phone]  Physical Therapy Treatment  Patient Details  Name: Erica Campbell MRN: [id number] Date of Birth: 02/25/1956 Referring Provider: Tanna Furry, MD   Encounter Date: 02/10/2018  PT End of Session - 02/10/18 0904    Visit Number  5    Date for PT Re-Evaluation  02/24/18    Authorization Type  BCBS    Authorization Time Period  01/25/18 to 02/24/18    PT Start Time  0847    PT Stop Time  0940    PT Time Calculation (min)  53 min    Activity Tolerance  No increased pain;Patient tolerated treatment well    Behavior During Therapy  [facility] for tasks assessed/performed       Past Medical History:  Diagnosis Date  . Allergic rhinitis   . Anemia    only in past, not current problem  . Asthma    since 1978  . Breast cancer ([place])    1999 s/p mastectomy right breast only.   . Chronic pain    pelvic  . Diabetes mellitus    2010  . H/O mastectomy   . HTN (hypertension)    1993  . Kidney stone    2012    Past Surgical History:  Procedure Laterality Date  . BREAST SURGERY    . CESAREAN SECTION     x3  . CHOLECYSTECTOMY     2007-chronic cholecystitis with cholelithiasis  . h/o mastectomy    . TOTAL ABDOMINAL HYSTERECTOMY     with cervix left only.   . TUBAL LIGATION     1981    There were no vitals filed for this visit.  Subjective Assessment - 02/10/18 0851    Subjective  Pt reports that she has been having good and bad days. She feels that today has been the best day overall. She is back to more of the lower back stiffness that she would typically have. She is going to use a foot rest at work now that it was approved.     Patient Stated Goals  improve pain and allow her stay at her desk without needing medication    Currently in Pain?  No/denies just stiffness     Pain Onset  More than a month ago         [facility]  PT Assessment - 02/10/18 0001      AROM   Cervical - Right Side Bend  30 pain free    Cervical - Left Side Bend  30 pain free    Cervical - Right Rotation  55    Cervical - Left Rotation  60                   [facility] Adult PT Treatment/Exercise - 02/10/18 0001      Self-Care   Other Self-Care Comments   benefits of taking walk breaks at work, completing stretches in the AM to decrease stiffness before starting her day; importance of completing gentle strengthening to increase tolerance for activity      Neck Exercises: Seated   Other Seated Exercise  BUE horizontal abduction with yellow TB x10 reps; BUE ER with elbows by side x15 reps       Lumbar Exercises: Supine   Other Supine Lumbar Exercises  hooklying trunk rotation x10 reps each direction; B knees to chest stretch with LE on red physioball  Moist Heat Therapy   Number Minutes Moist Heat  15 Minutes    Moist Heat Location  Cervical;Lumbar Spine      Electrical Stimulation   Electrical Stimulation Location  lumbar region    Electrical Stimulation Action  IFC    Electrical Stimulation Parameters  15 min    Electrical Stimulation Goals  Pain             PT Education - 02/10/18 0930    Education Details  see self-care; updated HEP    Person(s) Educated  Patient    Methods  Explanation;Handout;Verbal cues    Comprehension  Returned demonstration;Verbalized understanding       PT Short Term Goals - 01/25/18 1244      PT SHORT TERM GOAL #1   Title  Pt will demo consistency and independence with her initial HEP to decrease pain and improve body mechanics.     Time  2    Period  Weeks    Status  New    Target Date  02/08/18      PT SHORT TERM GOAL #2   Title  Pt will demo proper set up and use of the lumbar roll for additional support and improved spinal alignment throughout the day.    Time  2    Period  Weeks    Status  New        PT Long Term Goals - 01/25/18 1245      PT LONG TERM GOAL  #1   Title  Pt will demo improved BUE strength to 5/5 MMT which will reflect decrease in pain with resistance.    Time  4    Period  Weeks    Target Date  02/24/18      PT LONG TERM GOAL #2   Title  Pt will demo improved cervical active ROM to atlesat 60 deg each direction without pain, which will allow her to check blindspots while driving.     Time  4    Period  Weeks    Status  New      PT LONG TERM GOAL #3   Title  Pt will report atleast 75% improvement in her neck/shoulder pain from the start of therapy which will allow her to complete work activity without the need for medication.    Time  4    Period  Weeks    Status  New      PT LONG TERM GOAL #4   Title  Pt will demo improved posture awareness, evident by her ability to self correct posture throughout her session and without cuing from the therapist.    Time  4    Period  Weeks    Status  New            Plan - 02/10/18 0930    Clinical Impression Statement  Pt's soreness has resolved since last session. She has stiffness primarily upon arrival. She has improved cervical rotation AROM without pain this session up to 60 deg each direction. Completed therex to promote lumbar mobility and gradually increase scapular/thoracic strength. Pt did require encouragement to complete several exercises, but reported no pain or increased stiffness following today's session. Will continue with current POC.     Rehab Potential  Good    PT Frequency  2x / week    PT Duration  4 weeks    PT Treatment/Interventions  ADLs/Self Care Home Management;Moist Heat;Electrical Stimulation;Therapeutic activities;Neuromuscular re-education;Manual techniques;Dry needling;Passive range of motion;Patient/family  education;Taping    PT Next Visit Plan  introduce rows and diagonals, lumbar and thoracic flexibility and address pain/stiffness as needed.      PT Home Exercise Plan  [email or web address]     Consulted and Agree with Plan of Care  Patient       Patient  will benefit from skilled therapeutic intervention in order to improve the following deficits and impairments:  Decreased activity tolerance, Impaired flexibility, Decreased strength, Hypomobility, Decreased range of motion, Postural dysfunction, Increased muscle spasms, Pain, Improper body mechanics  Visit Diagnosis: Muscle spasm of back  Cervicalgia  Abnormal posture     Problem List Patient Active Problem List   Diagnosis Date Noted  . Back pain 01/04/2018  . Ruptured right breast implant 04/14/2016  . Vertigo 03/04/2016  . Healthcare maintenance 12/22/2015  . Transient adjustment reaction with anxiety 02/08/2015  . Dizziness 12/12/2014  . 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 H/O vitamin D deficiency 11/23/2012  . Hyperlipidemia with target LDL less than 100 11/23/2012  . Allergic rhinitis 11/22/2012  . Sinusitis, chronic 06/17/2012  . History of breast cancer   . Groin pain, left lower quadrant 01/27/2012  . Asthma   . Controlled type 2 diabetes mellitus without complication ([place])   . Essential hypertension     10:16 AM,02/10/18 Sherol Dade PT, DPT [place] [facility]  [facility] [address] [place], [place], [address] Phone: [phone]   Fax:  [phone]  Name: Erica Campbell MRN: [id number] Date of Birth: Nov 04, 1955

## 2018-02-11 ENCOUNTER — Telehealth: Payer: Self-pay

## 2018-02-11 NOTE — Telephone Encounter (Signed)
Prescription is at the front of this office, ready for pick up.

## 2018-02-11 NOTE — Telephone Encounter (Signed)
VM left on husbands line informing of order rx ready for pickup.

## 2018-02-11 NOTE — Telephone Encounter (Signed)
Pts husband called nurse line about pts foot rest prescription. Per chart review, I see where you were going to write this by the end of the week. I informed pts husband of this. Please let me know when done and I will inform Ben.

## 2018-02-14 ENCOUNTER — Other Ambulatory Visit: Payer: Self-pay | Admitting: Internal Medicine

## 2018-02-15 ENCOUNTER — Ambulatory Visit: Payer: BC Managed Care – PPO | Admitting: Physical Therapy

## 2018-02-15 ENCOUNTER — Encounter: Payer: Self-pay | Admitting: Physical Therapy

## 2018-02-15 DIAGNOSIS — M62830 Muscle spasm of back: Secondary | ICD-10-CM | POA: Diagnosis not present

## 2018-02-15 DIAGNOSIS — R293 Abnormal posture: Secondary | ICD-10-CM

## 2018-02-15 DIAGNOSIS — M542 Cervicalgia: Secondary | ICD-10-CM

## 2018-02-15 NOTE — Patient Instructions (Signed)
Access Code: [email or web address]  URL: [email or web address]  Date: 02/15/2018  Prepared by: Elly Modena   Exercises  Seated Hamstring Stretch - 3 reps - 20 hold - 3x daily - 7x weekly  Seated Piriformis Stretch - 3 reps - 1 sets - 20 hold - 3x daily - 7x weekly  Standing Cervical Rotation AROM with Overpressure - 10 reps - 3 sets - 1x daily - 7x weekly  Seated Trunk Rotation Stretch - 10 reps - 3 sets - 1x daily - 7x weekly  Seated Shoulder Horizontal Abduction with Resistance - 10 reps - 3 sets - 1x daily - 7x weekly  Standing Shoulder External Rotation with Resistance - 10 reps - 3 sets - 1x daily - 7x weekly  Standing Row with Resistance - 10 reps - 3 sets - 1x daily - 7x weekly  Patient Education  Office Posture   [facility] [address] [place], [place] [address] Phone # [phone] Fax [phone]

## 2018-02-15 NOTE — Therapy (Signed)
[facility] [address], [place], [place], [address] Phone: [phone]   Fax:  [phone]  Physical Therapy Treatment  Patient Details  Name: Erica Campbell MRN: [id number] Date of Birth: Dec 17, 1955 Referring Provider: Tanna Furry, MD   Encounter Date: 02/15/2018  PT End of Session - 02/15/18 0908    Visit Number  6    Date for PT Re-Evaluation  02/24/18    Authorization Type  BCBS    Authorization Time Period  01/25/18 to 02/24/18    PT Start Time  0859 pt arrived 15 min late    PT Stop Time  0931    PT Time Calculation (min)  32 min    Activity Tolerance  No increased pain;Patient tolerated treatment well    Behavior During Therapy  [facility] for tasks assessed/performed       Past Medical History:  Diagnosis Date  . Allergic rhinitis   . Anemia    only in past, not current problem  . Asthma    since 1978  . Breast cancer ([place])    1999 s/p mastectomy right breast only.   . Chronic pain    pelvic  . Diabetes mellitus    2010  . H/O mastectomy   . HTN (hypertension)    1993  . Kidney stone    2012    Past Surgical History:  Procedure Laterality Date  . BREAST SURGERY    . CESAREAN SECTION     x3  . CHOLECYSTECTOMY     2007-chronic cholecystitis with cholelithiasis  . h/o mastectomy    . TOTAL ABDOMINAL HYSTERECTOMY     with cervix left only.   . TUBAL LIGATION     1981    There were no vitals filed for this visit.  Subjective Assessment - 02/15/18 0900    Subjective  Pt notes that she had issues with her bowels following her last session which she thinks was aggravated by the heat on her back.     Patient Stated Goals  improve pain and allow her stay at her desk without needing medication    Currently in Pain?  Yes    Pain Score  2     Pain Location  Back    Pain Orientation  Lower    Pain Descriptors / Indicators  Aching;Tightness    Pain Type  Acute pain    Pain Radiating Towards   none     Pain Onset  More than a month ago    Pain Frequency  Intermittent    Aggravating Factors   unsure     Pain Relieving Factors  heat, medication    Effect of Pain on Daily Activities  always noting low back discomfort                        [facility] Adult PT Treatment/Exercise - 02/15/18 0001      Neck Exercises: Seated   Other Seated Exercise  BUE rows with green TB x10 reps       Neck Exercises: Supine   Cervical Rotation  Both;10 reps;Limitations    Cervical Rotation Limitations  pt providing over pressure stretch for 5 sec     Other Supine Exercise  B horizontal abduction with red TB x10 reps, D2 flexion each direction x10 reps with red TB      Lumbar Exercises: Seated   Other Seated Lumbar Exercises  anterior and posterior pelvic tilts x10  reps       Lumbar Exercises: Supine   Pelvic Tilt  10 reps    Other Supine Lumbar Exercises  hooklying trunk rotation Lt and Rt x10 reps     Other Supine Lumbar Exercises  straight leg hip extension isometric into table, hold 5 sec x10 reps       Moist Heat Therapy   Number Minutes Moist Heat  --    Moist Heat Location  --      Electrical Stimulation   Electrical Stimulation Location  --    Electrical Stimulation Action  --    Electrical Stimulation Parameters  --    Electrical Stimulation Goals  --      Manual Therapy   Manual therapy comments  AAROM cervical rotation Lt and Rt x10 reps each              PT Education - 02/15/18 0931    Education Details  updated HEP    Person(s) Educated  Patient    Methods  Explanation;Verbal cues;Handout    Comprehension  Verbalized understanding       PT Short Term Goals - 02/15/18 0910      PT SHORT TERM GOAL #1   Title  Pt will demo consistency and independence with her initial HEP to decrease pain and improve body mechanics.     Time  2    Period  Weeks    Status  Achieved      PT SHORT TERM GOAL #2   Title  Pt will demo proper set up and use of the  lumbar roll for additional support and improved spinal alignment throughout the day.    Time  2    Period  Weeks    Status  New        PT Long Term Goals - 02/15/18 0910      PT LONG TERM GOAL #1   Title  Pt will demo improved BUE strength to 5/5 MMT which will reflect decrease in pain with resistance.    Time  4    Period  Weeks      PT LONG TERM GOAL #2   Title  Pt will demo improved cervical active ROM to atlesat 60 deg each direction without pain, which will allow her to check blindspots while driving.     Time  4    Period  Weeks    Status  New      PT LONG TERM GOAL #3   Title  Pt will report atleast 75% improvement in her neck/shoulder pain from the start of therapy which will allow her to complete work activity without the need for medication.    Time  4    Period  Weeks    Status  New      PT LONG TERM GOAL #4   Title  Pt will demo improved posture awareness, evident by her ability to self correct posture throughout her session and without cuing from the therapist.    Time  4    Period  Weeks    Status  New            Plan - 02/15/18 0931    Clinical Impression Statement  Pt arrived late to today's appointment. Session focused on therex to promote core strength, lumbar flexibility and cervical ROM. Pt was able to activate deep abdominals during several new exercises this session. No pain was reported throughout the session, and therapist limited sets to incorporate  more pain free activity. Pt's foot rest should be arriving soon and we will plan to discuss this with her once she receives it.     Rehab Potential  Good    PT Frequency  2x / week    PT Duration  4 weeks    PT Treatment/Interventions  ADLs/Self Care Home Management;Moist Heat;Electrical Stimulation;Therapeutic activities;Neuromuscular re-education;Manual techniques;Dry needling;Passive range of motion;Patient/family education;Taping    PT Next Visit Plan  rows, extension, diagonals seated; lumbar and  thoracic flexibility and address pain/stiffness as needed.      PT Home Exercise Plan  [id number]     Consulted and Agree with Plan of Care  Patient       Patient will benefit from skilled therapeutic intervention in order to improve the following deficits and impairments:  Decreased activity tolerance, Impaired flexibility, Decreased strength, Hypomobility, Decreased range of motion, Postural dysfunction, Increased muscle spasms, Pain, Improper body mechanics  Visit Diagnosis: Muscle spasm of back  Cervicalgia  Abnormal posture     Problem List Patient Active Problem List   Diagnosis Date Noted  . Back pain 01/04/2018  . Ruptured right breast implant 04/14/2016  . Vertigo 03/04/2016  . Healthcare maintenance 12/22/2015  . Transient adjustment reaction with anxiety 02/08/2015  . Dizziness 12/12/2014  . 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 H/O vitamin D deficiency 11/23/2012  . Hyperlipidemia with target LDL less than 100 11/23/2012  . Allergic rhinitis 11/22/2012  . Sinusitis, chronic 06/17/2012  . History of breast cancer   . Groin pain, left lower quadrant 01/27/2012  . Asthma   . Controlled type 2 diabetes mellitus without complication ([place])   . Essential hypertension     9:35 AM,02/15/18 Sherol Dade PT, DPT [place] [facility] [place]  [facility] [address] [place], [place], [address] Phone: [phone]   Fax:  [phone]  Name: Erica Campbell MRN: [id number] Date of Birth: [birth year]

## 2018-02-17 ENCOUNTER — Ambulatory Visit: Payer: BC Managed Care – PPO | Admitting: Physical Therapy

## 2018-02-17 ENCOUNTER — Encounter: Payer: Self-pay | Admitting: Physical Therapy

## 2018-02-17 DIAGNOSIS — M62830 Muscle spasm of back: Secondary | ICD-10-CM

## 2018-02-17 DIAGNOSIS — R293 Abnormal posture: Secondary | ICD-10-CM

## 2018-02-17 DIAGNOSIS — M542 Cervicalgia: Secondary | ICD-10-CM

## 2018-02-17 NOTE — Therapy (Signed)
[facility] [address] [place], [place], [address] Phone: [phone]   Fax:  [phone]  Physical Therapy Treatment  Patient Details  Name: Erica Campbell MRN: [id number] Date of Birth: August 27, 1955 Referring Provider: Tanna Furry, MD   Encounter Date: 02/17/2018  PT End of Session - 02/17/18 0920    Visit Number  7    Date for PT Re-Evaluation  02/24/18    Authorization Type  BCBS    Authorization Time Period  01/25/18 to 02/24/18    PT Start Time  0848    PT Stop Time  0926    PT Time Calculation (min)  38 min    Activity Tolerance  No increased pain;Patient tolerated treatment well    Behavior During Therapy  [facility] for tasks assessed/performed       Past Medical History:  Diagnosis Date  . Allergic rhinitis   . Anemia    only in past, not current problem  . Asthma    since 1978  . Breast cancer ([place])    1999 s/p mastectomy right breast only.   . Chronic pain    pelvic  . Diabetes mellitus    2010  . H/O mastectomy   . HTN (hypertension)    1993  . Kidney stone    2012    Past Surgical History:  Procedure Laterality Date  . BREAST SURGERY    . CESAREAN SECTION     x3  . CHOLECYSTECTOMY     2007-chronic cholecystitis with cholelithiasis  . h/o mastectomy    . TOTAL ABDOMINAL HYSTERECTOMY     with cervix left only.   . TUBAL LIGATION     1981    There were no vitals filed for this visit.  Subjective Assessment - 02/17/18 0851    Subjective  Pt states that things are going well. She feels that her upper back and shoulder region is fully resolved and now her only issue is her low back.     Patient Stated Goals  improve pain and allow her stay at her desk without needing medication    Currently in Pain?  No/denies    Pain Onset  More than a month ago         [facility] PT Assessment - 02/17/18 0001      Strength   Right Shoulder Flexion  5/5    Right Shoulder ABduction  4/5    Left  Shoulder Flexion  4+/5    Left Shoulder ABduction  4/5                   [facility] Adult PT Treatment/Exercise - 02/17/18 0001      Lumbar Exercises: Stretches   Piriformis Stretch  Right;Left;2 reps;30 seconds      Lumbar Exercises: Standing   Row  Strengthening;Both;10 reps;Limitations    Row Limitations  red TB    Shoulder Extension  Strengthening;10 reps;Limitations    Shoulder Extension Limitations  red TB      Lumbar Exercises: Supine   Ab Set  10 reps;2 seconds    Clam  10 reps;Limitations    Clam Limitations  abdominal brace, red TB    Other Supine Lumbar Exercises  hooklying trunk rotation Lt/Rt x10 reps each direction    Other Supine Lumbar Exercises  B knees to chest with LE on red physioball x10 reps, pt over pressure stretch; abdominal brace with alternating LE march x10 reps  Shoulder Exercises: Stretch   Cross Chest Stretch  2 reps;20 seconds             PT Education - 02/17/18 0923    Education Details  technique with therex; expectations of soreness following therex    Person(s) Educated  Patient    Methods  Explanation;Handout;Verbal cues    Comprehension  Verbalized understanding;Returned demonstration       PT Short Term Goals - 02/17/18 0853      PT SHORT TERM GOAL #1   Title  Pt will demo consistency and independence with her initial HEP to decrease pain and improve body mechanics.     Time  2    Period  Weeks    Status  Achieved      PT SHORT TERM GOAL #2   Title  Pt will demo proper set up and use of the lumbar roll for additional support and improved spinal alignment throughout the day.    Time  2    Period  Weeks    Status  Achieved        PT Long Term Goals - 02/17/18 0853      PT LONG TERM GOAL #1   Title  Pt will demo improved BUE strength to 5/5 MMT which will reflect decrease in pain with resistance.    Time  4    Period  Weeks      PT LONG TERM GOAL #2   Title  Pt will demo improved cervical active ROM to  atlesat 60 deg each direction without pain, which will allow her to check blindspots while driving.     Baseline  Lt 65, Rt 70    Time  4    Period  Weeks    Status  Achieved      PT LONG TERM GOAL #3   Title  Pt will report atleast 75% improvement in her neck/shoulder pain from the start of therapy which will allow her to complete work activity without the need for medication.    Time  4    Period  Weeks    Status  New      PT LONG TERM GOAL #4   Title  Pt will demo improved posture awareness, evident by her ability to self correct posture throughout her session and without cuing from the therapist.    Time  4    Period  Weeks    Status  New            Plan - 02/17/18 9628    Clinical Impression Statement  Today's session focused on therex to further increase deep abdominal strength and hip strength/flexibility. Pt does require encouragement to complete exercises and stretches due to heightened awareness of muscle fatigue and pain. She has met another long term goal, reporting resolved cervical/shoulder pain and demonstrating atleast 60 deg of active rotation each direction. Ended session without complaints of pain, and pt had understanding of HEP additions.     Rehab Potential  Good    PT Frequency  2x / week    PT Duration  4 weeks    PT Treatment/Interventions  ADLs/Self Care Home Management;Moist Heat;Electrical Stimulation;Therapeutic activities;Neuromuscular re-education;Manual techniques;Dry needling;Passive range of motion;Patient/family education;Taping    PT Next Visit Plan  rows, extension, diagonals seated; lumbar and thoracic flexibility and address pain/stiffness as needed.      PT Home Exercise Plan  [id number]     Consulted and Agree with Plan of Care  Patient  Patient will benefit from skilled therapeutic intervention in order to improve the following deficits and impairments:  Decreased activity tolerance, Impaired flexibility, Decreased strength,  Hypomobility, Decreased range of motion, Postural dysfunction, Increased muscle spasms, Pain, Improper body mechanics  Visit Diagnosis: Muscle spasm of back  Cervicalgia  Abnormal posture     Problem List Patient Active Problem List   Diagnosis Date Noted  . Back pain 01/04/2018  . Ruptured right breast implant 04/14/2016  . Vertigo 03/04/2016  . Healthcare maintenance 12/22/2015  . Transient adjustment reaction with anxiety 02/08/2015  . Dizziness 12/12/2014  . 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 H/O vitamin D deficiency 11/23/2012  . Hyperlipidemia with target LDL less than 100 11/23/2012  . Allergic rhinitis 11/22/2012  . Sinusitis, chronic 06/17/2012  . History of breast cancer   . Groin pain, left lower quadrant 01/27/2012  . Asthma   . Controlled type 2 diabetes mellitus without complication ([place])   . Essential hypertension     9:28 AM,02/17/18 Sherol Dade PT, [place] [facility] [place]  [facility] [address] [place], [place], [address] Phone: [phone]   Fax:  [phone]  Name: Erica Campbell MRN: [id number] Date of Birth: 10-23-1955

## 2018-02-22 ENCOUNTER — Ambulatory Visit: Payer: BC Managed Care – PPO | Admitting: Physical Therapy

## 2018-02-22 DIAGNOSIS — R293 Abnormal posture: Secondary | ICD-10-CM

## 2018-02-22 DIAGNOSIS — M542 Cervicalgia: Secondary | ICD-10-CM

## 2018-02-22 DIAGNOSIS — M62830 Muscle spasm of back: Secondary | ICD-10-CM

## 2018-02-22 NOTE — Therapy (Signed)
[facility] [address] [place], [place], [address] Phone: [phone]   Fax:  [phone]  Physical Therapy Treatment  Patient Details  Name: Erica Campbell MRN: [id number] Date of Birth: Dec 05, 1955 Referring Provider: Tanna Furry, MD   Encounter Date: 02/22/2018  PT End of Session - 02/22/18 0858    Visit Number  8    Date for PT Re-Evaluation  02/24/18    Authorization Type  BCBS    Authorization Time Period  01/25/18 to 02/24/18    PT Start Time  0848 pt arrived late and 5 min untimed, pt in restroom    PT Stop Time  0929    PT Time Calculation (min)  41 min    Activity Tolerance  No increased pain;Patient tolerated treatment well    Behavior During Therapy  [facility] for tasks assessed/performed       Past Medical History:  Diagnosis Date  . Allergic rhinitis   . Anemia    only in past, not current problem  . Asthma    since 1978  . Breast cancer ([place])    1999 s/p mastectomy right breast only.   . Chronic pain    pelvic  . Diabetes mellitus    2010  . H/O mastectomy   . HTN (hypertension)    1993  . Kidney stone    2012    Past Surgical History:  Procedure Laterality Date  . BREAST SURGERY    . CESAREAN SECTION     x3  . CHOLECYSTECTOMY     2007-chronic cholecystitis with cholelithiasis  . h/o mastectomy    . TOTAL ABDOMINAL HYSTERECTOMY     with cervix left only.   . TUBAL LIGATION     1981    There were no vitals filed for this visit.  Subjective Assessment - 02/22/18 0851    Subjective  Pt states that she is trying some of her exercises. No pain in the neck, but she does have some Lt hip/thigh pain.     Patient Stated Goals  improve pain and allow her stay at her desk without needing medication    Currently in Pain?  Yes    Pain Score  2     Pain Location  Hip    Pain Orientation  Left;Lateral    Pain Descriptors / Indicators  Aching;Tightness    Pain Type  Acute pain    Pain  Radiating Towards  none     Pain Onset  More than a month ago    Pain Frequency  Intermittent    Aggravating Factors   unsure, woke up with it    Pain Relieving Factors  heat, medication    Effect of Pain on Daily Activities  always noting low back discomfort                 [facility] Adult PT Treatment/Exercise - 02/22/18 0001      Neck Exercises: Supine   Upper Extremity D2  15 reps;Flexion;Theraband    Theraband Level (UE D2)  Level 1 (Yellow)    Other Supine Exercise  horizontal abduction with yellow TB x20 reps      Lumbar Exercises: Aerobic   Nustep  L2 x5 min, PT present to encourage SPM above 30      Lumbar Exercises: Supine   Clam  10 reps    Clam Limitations  abdominal brace, red TB, x2 sets     Heel Slides  10 reps;Limitations  Heel Slides Limitations  abdominal brace    Other Supine Lumbar Exercises  trunk rotation with LE on red physioball x10 reps each direction      Shoulder Exercises: Standing   Row  Both;20 reps;Theraband    Theraband Level (Shoulder Row)  Level 2 (Red)             PT Education - 02/22/18 0857    Education Details  importance of completing HEP as instructed for optimal benefit    Person(s) Educated  Patient    Methods  Explanation    Comprehension  Verbalized understanding       PT Short Term Goals - 02/17/18 0853      PT SHORT TERM GOAL #1   Title  Pt will demo consistency and independence with her initial HEP to decrease pain and improve body mechanics.     Time  2    Period  Weeks    Status  Achieved      PT SHORT TERM GOAL #2   Title  Pt will demo proper set up and use of the lumbar roll for additional support and improved spinal alignment throughout the day.    Time  2    Period  Weeks    Status  Achieved        PT Long Term Goals - 02/17/18 0853      PT LONG TERM GOAL #1   Title  Pt will demo improved BUE strength to 5/5 MMT which will reflect decrease in pain with resistance.    Time  4    Period  Weeks       PT LONG TERM GOAL #2   Title  Pt will demo improved cervical active ROM to atlesat 60 deg each direction without pain, which will allow her to check blindspots while driving.     Baseline  Lt 65, Rt 70    Time  4    Period  Weeks    Status  Achieved      PT LONG TERM GOAL #3   Title  Pt will report atleast 75% improvement in her neck/shoulder pain from the start of therapy which will allow her to complete work activity without the need for medication.    Time  4    Period  Weeks    Status  New      PT LONG TERM GOAL #4   Title  Pt will demo improved posture awareness, evident by her ability to self correct posture throughout her session and without cuing from the therapist.    Time  4    Period  Weeks    Status  New            Plan - 02/22/18 3710    Clinical Impression Statement  Pt arrives with reports that she got her foot rest and this has helped a lot with work. She has some pain along the Lt hip upon arrival, which remain unchanged during today's session. Therapist encouraged gentle resistance exercise and pt was able to complete without difficulty.  Overall, she feels improvements in her stiffness and pain and will likely be discharged next visit after review of her advanced HEP.    Rehab Potential  Good    PT Frequency  2x / week    PT Duration  4 weeks    PT Treatment/Interventions  ADLs/Self Care Home Management;Moist Heat;Electrical Stimulation;Therapeutic activities;Neuromuscular re-education;Manual techniques;Dry needling;Passive range of motion;Patient/family education;Taping    PT Next Visit  Plan  plan for d/c and update HEP    PT Home Exercise Plan  [id number]     Consulted and Agree with Plan of Care  Patient       Patient will benefit from skilled therapeutic intervention in order to improve the following deficits and impairments:  Decreased activity tolerance, Impaired flexibility, Decreased strength, Hypomobility, Decreased range of motion, Postural  dysfunction, Increased muscle spasms, Pain, Improper body mechanics  Visit Diagnosis: Muscle spasm of back  Cervicalgia  Abnormal posture     Problem List Patient Active Problem List   Diagnosis Date Noted  . Back pain 01/04/2018  . Ruptured right breast implant 04/14/2016  . Vertigo 03/04/2016  . Healthcare maintenance 12/22/2015  . Transient adjustment reaction with anxiety 02/08/2015  . Dizziness 12/12/2014  . 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 H/O vitamin D deficiency 11/23/2012  . Hyperlipidemia with target LDL less than 100 11/23/2012  . Allergic rhinitis 11/22/2012  . Sinusitis, chronic 06/17/2012  . History of breast cancer   . Groin pain, left lower quadrant 01/27/2012  . Asthma   . Controlled type 2 diabetes mellitus without complication ([place])   . Essential hypertension     9:33 AM,02/22/18 Sherol Dade PT, [place] [facility] [place]  [facility] [address] [place], [place], [address] Phone: [phone]   Fax:  [phone]  Name: Solaris Kram MRN: [id number] Date of Birth: 19-Oct-1955

## 2018-02-24 ENCOUNTER — Ambulatory Visit: Payer: BC Managed Care – PPO | Admitting: Physical Therapy

## 2018-02-24 ENCOUNTER — Encounter: Payer: Self-pay | Admitting: Physical Therapy

## 2018-02-24 DIAGNOSIS — M62830 Muscle spasm of back: Secondary | ICD-10-CM | POA: Diagnosis not present

## 2018-02-24 DIAGNOSIS — R293 Abnormal posture: Secondary | ICD-10-CM

## 2018-02-24 DIAGNOSIS — M542 Cervicalgia: Secondary | ICD-10-CM

## 2018-02-24 NOTE — Therapy (Signed)
[facility] [address] [place], [place], [address] Phone: [phone]   Fax:  [phone]  Physical Therapy Treatment/Discharge   Patient Details  Name: Erica Campbell MRN: [id number] Date of Birth: May 18, 1956 Referring Provider: Tanna Furry, MD   Encounter Date: 02/24/2018  PT End of Session - 02/24/18 0911    Visit Number  9    Date for PT Re-Evaluation  02/24/18    Authorization Type  BCBS    Authorization Time Period  01/25/18 to 02/24/18    PT Start Time  0852 Pt arrived late and requested to leave early for work     PT Stop Time  0925    PT Time Calculation (min)  33 min    Activity Tolerance  No increased pain;Patient tolerated treatment well    Behavior During Therapy  [facility] for tasks assessed/performed       Past Medical History:  Diagnosis Date  . Allergic rhinitis   . Anemia    only in past, not current problem  . Asthma    since 1978  . Breast cancer ([place])    1999 s/p mastectomy right breast only.   . Chronic pain    pelvic  . Diabetes mellitus    2010  . H/O mastectomy   . HTN (hypertension)    1993  . Kidney stone    2012    Past Surgical History:  Procedure Laterality Date  . BREAST SURGERY    . CESAREAN SECTION     x3  . CHOLECYSTECTOMY     2007-chronic cholecystitis with cholelithiasis  . h/o mastectomy    . TOTAL ABDOMINAL HYSTERECTOMY     with cervix left only.   . TUBAL LIGATION     1981    There were no vitals filed for this visit.  Subjective Assessment - 02/24/18 0854    Subjective  Pt states that things have been good. She has a little bit of stiffness in her neck. Overall, she feels atleast 85% improved since beginning. She has not been doing any of her exercises the past 2 days.     Patient Stated Goals  improve pain and allow her stay at her desk without needing medication    Currently in Pain?  No/denies    Pain Onset  More than a month ago         [facility]  PT Assessment - 02/24/18 0001      Assessment   Medical Diagnosis  Midline LBP, acute Lt shoulder and neck pain     Prior Therapy  none       Precautions   Precautions  None      Balance Screen   Has the patient fallen in the past 6 months  No    Has the patient had a decrease in activity level because of a fear of falling?   No    Is the patient reluctant to leave their home because of a fear of falling?   No      Home Film/video editor residence      Prior Function   Level of Independence  Independent    Vocation  Full time employment      Cognition   Overall Cognitive Status  Within Functional Limits for tasks assessed      Observation/Other Assessments   Observations  pt sitting upright      Sensation   Additional Comments  no numbness  reported other than her hands which she can relieve with improved posture      Posture/Postural Control   Posture/Postural Control  No significant limitations    Posture Comments  --      AROM   Cervical Flexion  WFL, pain free    Cervical Extension  WFL, pain free    Cervical - Right Side Bend  35 pain Lt upper trap    Cervical - Left Side Bend  45    Cervical - Right Rotation  60    Cervical - Left Rotation  60      Strength   Right Shoulder Flexion  5/5    Right Shoulder ABduction  5/5    Left Shoulder Flexion  5/5    Left Shoulder ABduction  5/5      Palpation   Palpation comment  --               [facility] Adult PT Treatment/Exercise - 02/24/18 0001      Lumbar Exercises: Stretches   Piriformis Stretch  2 reps;Left;Right;20 seconds;Limitations    Piriformis Stretch Limitations  seated      Lumbar Exercises: Supine   Bridge  10 reps    Straight Leg Raise  10 reps;Limitations    Straight Leg Raises Limitations  abdominal brace    Other Supine Lumbar Exercises  low trunk rotation x10 reps Lt and Rt; SKTC 5x10 sec each side      Shoulder Exercises: Supine   Other Supine Exercises   hooklying abdominal brace with alternating UE flexion using yellow TB x10 reps              PT Education - 02/24/18 0958    Education Details  goals met; updated HEP and discussed importance of completing HEP moving forward     Person(s) Educated  Patient    Methods  Explanation;Handout;Verbal cues    Comprehension  Verbalized understanding;Returned demonstration       PT Short Term Goals - 02/24/18 0901      PT SHORT TERM GOAL #1   Title  Pt will demo consistency and independence with her initial HEP to decrease pain and improve body mechanics.     Time  2    Period  Weeks    Status  Achieved      PT SHORT TERM GOAL #2   Title  Pt will demo proper set up and use of the lumbar roll for additional support and improved spinal alignment throughout the day.    Time  2    Period  Weeks    Status  Achieved        PT Long Term Goals - 02/24/18 0901      PT LONG TERM GOAL #1   Title  Pt will demo improved BUE strength to 5/5 MMT which will reflect decrease in pain with resistance.    Baseline  5/5 MMT    Time  4    Period  Weeks    Status  Achieved      PT LONG TERM GOAL #2   Title  Pt will demo improved cervical active ROM to atlesat 60 deg each direction without pain, which will allow her to check blindspots while driving.     Time  4    Period  Weeks    Status  Achieved      PT LONG TERM GOAL #3   Title  Pt will report atleast 75% improvement in her neck/shoulder  pain from the start of therapy which will allow her to complete work activity without the need for medication.    Baseline  85% improvement     Time  4    Period  Weeks    Status  Achieved      PT LONG TERM GOAL #4   Title  Pt will demo improved posture awareness, evident by her ability to self correct posture throughout her session and without cuing from the therapist.    Time  4    Period  Weeks    Status  Achieved            Plan - 02/24/18 0917    Clinical Impression Statement  Pt was  discharged this visit having met all of her short and long term goals. 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 She demonstrates atleast 60 deg of active cervical rotation, full UE strength and she is able to maintain upright posture throughout her sessions without the need for cuing from the therapist. Pt's HEP has been updated and reviewed to ensure she has full understanding of each exercise and she feels comfortable continuing with this on her own moving forward.    Rehab Potential  Good    PT Frequency  2x / week    PT Duration  4 weeks    PT Treatment/Interventions  ADLs/Self Care Home Management;Moist Heat;Electrical Stimulation;Therapeutic activities;Neuromuscular re-education;Manual techniques;Dry needling;Passive range of motion;Patient/family education;Taping    PT Next Visit Plan  plan for d/c and update HEP    PT Home Exercise Plan  [id number]     Consulted and Agree with Plan of Care  Patient       Patient will benefit from skilled therapeutic intervention in order to improve the following deficits and impairments:  Decreased activity tolerance, Impaired flexibility, Decreased strength, Hypomobility, Decreased range of motion, Postural dysfunction, Increased muscle spasms, Pain, Improper body mechanics  Visit Diagnosis: Muscle spasm of back  Cervicalgia  Abnormal posture     Problem List Patient Active Problem List   Diagnosis Date Noted  . Back pain 01/04/2018  . Ruptured right breast implant 04/14/2016  . Vertigo 03/04/2016  . Healthcare maintenance 12/22/2015  . Transient adjustment reaction with anxiety 02/08/2015  . Dizziness 12/12/2014  . 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 H/O vitamin D deficiency 11/23/2012  . Hyperlipidemia with target LDL less than 100 11/23/2012  . Allergic rhinitis 11/22/2012  . Sinusitis, chronic 06/17/2012  . History of breast cancer   . Groin pain,  left lower quadrant 01/27/2012  . Asthma   . Controlled type 2 diabetes mellitus without complication ([place])   . Essential hypertension    PHYSICAL THERAPY DISCHARGE SUMMARY  Visits from Start of Care: 9  Current functional level related to goals / functional outcomes:. See above for more details    Remaining deficits: See above for more details    Education / Equipment: See above for more details   Plan: Patient agrees to discharge.  Patient goals were met. Patient is being discharged due to meeting the stated rehab goals.  ?????            10:00 AM,02/24/18 [place], [place] at [place]  [facility] [address], [place], [place], [address] Phone: [phone]   Fax:  [phone]  Name: Erica  Campbell MRN: [id number] Date of Birth: 1955-12-26

## 2018-02-24 NOTE — Patient Instructions (Signed)
Access Code: [email or web address]  URL: [email or web address]  Date: 02/24/2018  Prepared by: Elly Modena   Exercises  Seated Piriformis Stretch - 3 reps - 1 sets - 20 hold - 3x daily - 7x weekly  Standing Cervical Rotation AROM with Overpressure - 10 reps - 3 sets - 1x daily - 7x weekly  Seated Trunk Rotation Stretch - 10 reps - 3 sets - 1x daily - 7x weekly  Seated Shoulder Horizontal Abduction with Resistance - 10 reps - 3 sets - 1x daily - 7x weekly  Standing Shoulder External Rotation with Resistance - 10 reps - 3 sets - 1x daily - 7x weekly  Standing Row with Resistance - 10 reps - 3 sets - 1x daily - 7x weekly  Hooklying Clamshell with Resistance - 10 reps - 3 sets - 1x daily - 7x weekly  Supine Lower Trunk Rotation - 10 reps - 2 sets - 1x daily - 7x weekly  Hip Flexion Stretch - 5 reps - 2 sets - 10 hold - 1x daily - 7x weekly  Supine Bridge - 10 reps - 2 sets - 1x daily - 7x weekly  Patient Education  Office Posture   [facility] [address], [place], [place] [address] Phone # [phone] Fax [phone]

## 2018-03-21 ENCOUNTER — Other Ambulatory Visit: Payer: Self-pay | Admitting: Internal Medicine

## 2018-03-21 DIAGNOSIS — J309 Allergic rhinitis, unspecified: Secondary | ICD-10-CM

## 2018-03-26 ENCOUNTER — Encounter: Payer: Self-pay | Admitting: Family Medicine

## 2018-03-30 ENCOUNTER — Ambulatory Visit: Payer: BC Managed Care – PPO | Admitting: Family Medicine

## 2018-03-30 ENCOUNTER — Other Ambulatory Visit: Payer: Self-pay

## 2018-03-30 VITALS — BP 138/80 | HR 108 | Temp 98.3°F | Wt 180.0 lb

## 2018-03-30 DIAGNOSIS — R197 Diarrhea, unspecified: Secondary | ICD-10-CM | POA: Diagnosis not present

## 2018-03-30 DIAGNOSIS — Z1211 Encounter for screening for malignant neoplasm of colon: Secondary | ICD-10-CM

## 2018-03-30 MED ORDER — DICYCLOMINE HCL 10 MG PO CAPS: 10.0000 mg | ORAL_CAPSULE | Freq: Three times a day (TID) | ORAL | 1 refills | Status: DC | PRN

## 2018-03-30 MED ORDER — ATENOLOL 50 MG PO TABS: 50.0000 mg | ORAL_TABLET | Freq: Every day | ORAL | 1 refills | Status: DC

## 2018-03-30 NOTE — Assessment & Plan Note (Signed)
  Continues to have pain, continues to work with PT. She is looking for a heating device to help with pain. I told her I do not think insurance covers things like that but if she finds one at the medical supply store I'd be happy to write a prescription for it to see if insurance will cover. Follow up with PCP as needed.
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
## 2018-03-30 NOTE — Assessment & Plan Note (Addendum)
  Unclear cause. Doubt this is infectious. No red flag symptoms of weight loss or blood in stool. Patient has been on metformin for years. She has had gallbladder out in past. This sounds more functional in that it's worse with activity (PT exercises). Will try bentyl to relieve cramping and dietary changes. Do not think patient needs lab work or imaging at this time. Will follow up in 2 weeks to reassess. Could consider stool studies. Patient verbalized understanding and agreement with plan.

## 2018-03-30 NOTE — Assessment & Plan Note (Signed)
  Due for colonoscopy, will refer to GI to have this done.

## 2018-03-30 NOTE — Patient Instructions (Signed)
Good to see you today Please try the bentyl to see if this helps Please let me know the name of the medical supplies if you find it at the store and I'll be happy to write a prescription for you  I referred you for a colonoscopy, you will be called to schedule this.  I'll see you back in about 2 weeks to check in.  If you have questions or concerns please do not hesitate to call at [phone].  Lucila Maine, DO PGY-3, [facility] 03/30/2018 9:39 AM   Food Choices to Help Relieve Diarrhea, Adult When you have diarrhea, the foods you eat and your eating habits are very important. Choosing the right foods and drinks can help:  Relieve diarrhea.  Replace lost fluids and nutrients.  Prevent dehydration.  What general guidelines should I follow? Relieving diarrhea  Choose foods with less than 2 g or .07 oz. of fiber per serving.  Limit fats to less than 8 tsp (38 g or 1.34 oz.) a day.  Avoid the following: ? Foods and beverages sweetened with high-fructose corn syrup, honey, or sugar alcohols such as xylitol, sorbitol, and mannitol. ? Foods that contain a lot of fat or sugar. ? Fried, greasy, or spicy foods. ? High-fiber grains, breads, and cereals. ? Raw fruits and vegetables.  Eat foods that are rich in probiotics. These foods include dairy products such as yogurt and fermented milk products. They help increase healthy bacteria in the stomach and intestines (gastrointestinal tract, or GI tract).  If you have lactose intolerance, avoid dairy products. These may make your diarrhea worse.  Take medicine to help stop diarrhea (antidiarrheal medicine) only as told by your health care provider. Replacing nutrients  Eat small meals or snacks every 3-4 hours.  Eat bland foods, such as white rice, toast, or baked potato, until your diarrhea starts to get better. Gradually reintroduce nutrient-rich foods as tolerated or as told by your health care provider. This  includes: ? Well-cooked protein foods. ? Peeled, seeded, and soft-cooked fruits and vegetables. ? Low-fat dairy products.  Take vitamin and mineral supplements as told by your health care provider. Preventing dehydration   Start by sipping water or a special solution to prevent dehydration (oral rehydration solution, ORS). Urine that is clear or pale yellow means that you are getting enough fluid.  Try to drink at least 8-10 cups of fluid each day to help replace lost fluids.  You may add other liquids in addition to water, such as clear juice or decaffeinated sports drinks, as tolerated or as told by your health care provider.  Avoid drinks with caffeine, such as coffee, tea, or soft drinks.  Avoid alcohol. What foods are recommended? The items listed may not be a complete list. Talk with your health care provider about what dietary choices are best for you. Grains White rice. White, [place], or pita breads (fresh or toasted), including plain rolls, buns, or bagels. White pasta. Saltine, soda, or graham crackers. Pretzels. Low-fiber cereal. Cooked cereals made with water (such as cornmeal, farina, or cream cereals). Plain muffins. Matzo. Melba toast. Zwieback. Vegetables Potatoes (without the skin). Most well-cooked and canned vegetables without skins or seeds. Tender lettuce. Fruits Apple sauce. Fruits canned in juice. Cooked apricots, cherries, grapefruit, peaches, pears, or plums. Fresh bananas and cantaloupe. Meats and other protein foods Baked or boiled chicken. Eggs. Tofu. Fish. Seafood. Smooth nut butters. Ground or well-cooked tender beef, ham, veal, lamb, pork, or poultry. Dairy Plain yogurt,  kefir, and unsweetened liquid yogurt. Lactose-free milk, buttermilk, skim milk, or soy milk. Low-fat or nonfat hard cheese. Beverages Water. Low-calorie sports drinks. Fruit juices without pulp. Strained tomato and vegetable juices. Decaffeinated teas. Sugar-free beverages not sweetened  with sugar alcohols. Oral rehydration solutions, if approved by your health care provider. Seasoning and other foods Bouillon, broth, or soups made from recommended foods. What foods are not recommended? The items listed may not be a complete list. Talk with your health care provider about what dietary choices are best for you. Grains Whole grain, whole wheat, bran, or rye breads, rolls, pastas, and crackers. Wild or brown rice. Whole grain or bran cereals. Barley. Oats and oatmeal. Corn tortillas or taco shells. Granola. Popcorn. Vegetables Raw vegetables. Fried vegetables. Cabbage, broccoli, Brussels sprouts, artichokes, baked beans, beet greens, corn, kale, legumes, peas, sweet potatoes, and yams. Potato skins. Cooked spinach and cabbage. Fruits Dried fruit, including raisins and dates. Raw fruits. Stewed or dried prunes. Canned fruits with syrup. Meat and other protein foods Fried or fatty meats. Deli meats. Chunky nut butters. Nuts and seeds. Beans and lentils. Berniece Salines. Hot dogs. Sausage. Dairy High-fat cheeses. Whole milk, chocolate milk, and beverages made with milk, such as milk shakes. Half-and-half. Cream. sour cream. Ice cream. Beverages Caffeinated beverages (such as coffee, tea, soda, or energy drinks). Alcoholic beverages. Fruit juices with pulp. Prune juice. Soft drinks sweetened with high-fructose corn syrup or sugar alcohols. High-calorie sports drinks. Fats and oils Butter. Cream sauces. Margarine. Salad oils. Plain salad dressings. Olives. Avocados. Mayonnaise. Sweets and desserts Sweet rolls, doughnuts, and sweet breads. Sugar-free desserts sweetened with sugar alcohols such as xylitol and sorbitol. Seasoning and other foods Honey. Hot sauce. Chili powder. Gravy. Cream-based or milk-based soups. Pancakes and waffles. Summary  When you have diarrhea, the foods you eat and your eating habits are very important.  Make sure you get at least 8-10 cups of fluid each day, or  enough to keep your urine clear or pale yellow.  Eat bland foods and gradually reintroduce healthy, nutrient-rich foods as tolerated, or as told by your health care provider.  Avoid high-fiber, fried, greasy, or spicy foods. This information is not intended to replace advice given to you by your health care provider. Make sure you discuss any questions you have with your health care provider. Document Released: 10/10/2003 Document Revised: 07/17/2016 Document Reviewed: 07/17/2016 Elsevier Interactive Patient Education  [employer].

## 2018-04-13 ENCOUNTER — Ambulatory Visit: Payer: BC Managed Care – PPO | Admitting: Family Medicine

## 2018-04-23 ENCOUNTER — Other Ambulatory Visit: Payer: Self-pay | Admitting: Internal Medicine

## 2018-04-27 ENCOUNTER — Other Ambulatory Visit: Payer: Self-pay | Admitting: Family Medicine

## 2018-04-27 ENCOUNTER — Other Ambulatory Visit: Payer: Self-pay | Admitting: Internal Medicine

## 2018-04-29 ENCOUNTER — Other Ambulatory Visit: Payer: Self-pay | Admitting: Family Medicine

## 2018-04-29 MED ORDER — HYDROCHLOROTHIAZIDE 25 MG PO TABS: 25.0000 mg | ORAL_TABLET | Freq: Every day | ORAL | 0 refills | Status: DC

## 2018-04-29 NOTE — Progress Notes (Signed)
hct

## 2018-05-13 ENCOUNTER — Other Ambulatory Visit: Payer: Self-pay | Admitting: Family Medicine

## 2018-05-13 DIAGNOSIS — J309 Allergic rhinitis, unspecified: Secondary | ICD-10-CM

## 2018-05-25 ENCOUNTER — Ambulatory Visit (INDEPENDENT_AMBULATORY_CARE_PROVIDER_SITE_OTHER): Payer: BC Managed Care – PPO

## 2018-05-25 DIAGNOSIS — Z23 Encounter for immunization: Secondary | ICD-10-CM | POA: Diagnosis not present

## 2018-05-28 ENCOUNTER — Other Ambulatory Visit: Payer: Self-pay | Admitting: Internal Medicine

## 2018-06-10 ENCOUNTER — Other Ambulatory Visit: Payer: Self-pay

## 2018-06-10 ENCOUNTER — Encounter: Payer: Self-pay | Admitting: Family Medicine

## 2018-06-10 ENCOUNTER — Ambulatory Visit: Payer: BC Managed Care – PPO | Admitting: Family Medicine

## 2018-06-10 VITALS — BP 142/86 | HR 81 | Temp 98.7°F | Ht 63.0 in | Wt 181.4 lb

## 2018-06-10 DIAGNOSIS — J309 Allergic rhinitis, unspecified: Secondary | ICD-10-CM

## 2018-06-10 DIAGNOSIS — J329 Chronic sinusitis, unspecified: Secondary | ICD-10-CM

## 2018-06-10 DIAGNOSIS — E119 Type 2 diabetes mellitus without complications: Secondary | ICD-10-CM

## 2018-06-10 LAB — POCT GLYCOSYLATED HEMOGLOBIN (HGB A1C): HbA1c, POC (controlled diabetic range): 7.2 % — AB (ref 0.0–7.0)

## 2018-06-10 MED ORDER — DOCUSATE SODIUM 100 MG PO CAPS: 100.0000 mg | ORAL_CAPSULE | Freq: Two times a day (BID) | ORAL | 3 refills | Status: DC

## 2018-06-10 MED ORDER — FLUTICASONE PROPIONATE 50 MCG/ACT NA SUSP: 2.0000 | Freq: Every day | NASAL | 6 refills | Status: DC

## 2018-06-10 MED ORDER — CARBOXYMETHYLCELLULOSE SODIUM 0.5 % OP SOLN: 1.0000 [drp] | OPHTHALMIC | 5 refills | Status: DC | PRN

## 2018-06-10 MED ORDER — AMOXICILLIN-POT CLAVULANATE 875-125 MG PO TABS: 1.0000 | ORAL_TABLET | Freq: Two times a day (BID) | ORAL | 0 refills | Status: DC

## 2018-06-10 MED ORDER — ALBUTEROL SULFATE HFA 108 (90 BASE) MCG/ACT IN AERS: 2.0000 | INHALATION_SPRAY | Freq: Four times a day (QID) | RESPIRATORY_TRACT | 1 refills | Status: DC | PRN

## 2018-06-10 NOTE — Assessment & Plan Note (Signed)
Worthy of antibiotics Switch atrovent to flonase.

## 2018-06-10 NOTE — Assessment & Plan Note (Signed)
Restart OTC allegra Switch atrovent to flonase.

## 2018-06-10 NOTE — Patient Instructions (Signed)
Stop the atrovent and start the flonase nasal spray ongoing to keep the allergies down. Also oK to use allegra OTC I refilled meds you requested I sent in an antibiotic and flonase

## 2018-06-10 NOTE — Assessment & Plan Note (Signed)
Not quite at goal.  Will FU with PCP

## 2018-06-10 NOTE — Progress Notes (Signed)
   Subjective:    Patient ID: Erica Campbell, female    DOB: [birth year], 62 y.o.   MRN: [id number]  HPI 4-5 day flair of chronic sinusitis.  Has not been taking her regular OTC allegra or her atrovent.  Unclear why on atrovent rather than flonase for her chronic allergic rhinitis.  Low grade fever.  Facial pain.  Lack of energy. Needs work note. Needs refills on multiple meds. Due for A1C check.  7.2  She will FU with PCP Dr. Enid Derry   Review of Systems     Objective:   Physical Exam  No facial swelling  Mild pain to percussion of sinuses.        Assessment & Plan:

## 2018-06-25 ENCOUNTER — Other Ambulatory Visit: Payer: Self-pay | Admitting: Family Medicine

## 2018-07-26 ENCOUNTER — Other Ambulatory Visit: Payer: Self-pay | Admitting: Family Medicine

## 2018-07-28 NOTE — Telephone Encounter (Signed)
LM for patient to call back and schedule an appointment for a follow up of her blood pressure. Anabel Lykins,CMA

## 2018-08-06 ENCOUNTER — Other Ambulatory Visit: Payer: Self-pay | Admitting: Family Medicine

## 2018-08-08 ENCOUNTER — Other Ambulatory Visit: Payer: Self-pay | Admitting: Family Medicine

## 2018-11-14 ENCOUNTER — Other Ambulatory Visit: Payer: Self-pay | Admitting: Family Medicine

## 2018-11-15 ENCOUNTER — Encounter: Payer: Self-pay | Admitting: Family Medicine

## 2018-11-15 ENCOUNTER — Other Ambulatory Visit: Payer: Self-pay | Admitting: Family Medicine

## 2018-11-15 MED ORDER — LOSARTAN POTASSIUM 100 MG PO TABS: 100.0000 mg | ORAL_TABLET | Freq: Every day | ORAL | 0 refills | Status: DC

## 2018-12-09 ENCOUNTER — Other Ambulatory Visit: Payer: Self-pay | Admitting: Family Medicine

## 2018-12-12 ENCOUNTER — Other Ambulatory Visit: Payer: Self-pay

## 2018-12-13 MED ORDER — METFORMIN HCL 500 MG PO TABS: 500.0000 mg | ORAL_TABLET | Freq: Two times a day (BID) | ORAL | 3 refills | Status: DC

## 2018-12-15 ENCOUNTER — Other Ambulatory Visit: Payer: Self-pay

## 2018-12-15 ENCOUNTER — Encounter: Payer: Self-pay | Admitting: Family Medicine

## 2018-12-15 MED ORDER — HYDROCHLOROTHIAZIDE 25 MG PO TABS: 25.0000 mg | ORAL_TABLET | Freq: Every day | ORAL | 1 refills | Status: DC

## 2019-01-11 ENCOUNTER — Other Ambulatory Visit: Payer: Self-pay

## 2019-01-11 MED ORDER — LOVASTATIN 20 MG PO TABS: ORAL_TABLET | ORAL | 3 refills | Status: DC

## 2019-01-13 ENCOUNTER — Other Ambulatory Visit: Payer: Self-pay | Admitting: Family Medicine

## 2019-01-13 DIAGNOSIS — J309 Allergic rhinitis, unspecified: Secondary | ICD-10-CM

## 2019-01-30 ENCOUNTER — Other Ambulatory Visit: Payer: Self-pay

## 2019-01-30 MED ORDER — AMLODIPINE BESYLATE 5 MG PO TABS: 5.0000 mg | ORAL_TABLET | Freq: Every day | ORAL | 3 refills | Status: DC

## 2019-03-12 ENCOUNTER — Other Ambulatory Visit: Payer: Self-pay | Admitting: Family Medicine

## 2019-03-12 DIAGNOSIS — J309 Allergic rhinitis, unspecified: Secondary | ICD-10-CM

## 2019-05-15 ENCOUNTER — Other Ambulatory Visit: Payer: Self-pay | Admitting: Family Medicine

## 2019-05-25 ENCOUNTER — Ambulatory Visit: Payer: BC Managed Care – PPO

## 2019-05-26 ENCOUNTER — Ambulatory Visit (INDEPENDENT_AMBULATORY_CARE_PROVIDER_SITE_OTHER): Payer: BC Managed Care – PPO

## 2019-05-26 ENCOUNTER — Other Ambulatory Visit: Payer: Self-pay | Admitting: Family Medicine

## 2019-05-26 ENCOUNTER — Other Ambulatory Visit: Payer: Self-pay

## 2019-05-26 DIAGNOSIS — Z23 Encounter for immunization: Secondary | ICD-10-CM

## 2019-05-26 DIAGNOSIS — Z1231 Encounter for screening mammogram for malignant neoplasm of breast: Secondary | ICD-10-CM

## 2019-05-30 ENCOUNTER — Ambulatory Visit: Payer: BC Managed Care – PPO

## 2019-06-12 ENCOUNTER — Encounter: Payer: Self-pay | Admitting: Family Medicine

## 2019-06-12 ENCOUNTER — Ambulatory Visit (INDEPENDENT_AMBULATORY_CARE_PROVIDER_SITE_OTHER): Payer: BC Managed Care – PPO | Admitting: Family Medicine

## 2019-06-12 ENCOUNTER — Other Ambulatory Visit: Payer: Self-pay

## 2019-06-12 VITALS — BP 124/74 | HR 80 | Ht 63.0 in | Wt 188.0 lb

## 2019-06-12 DIAGNOSIS — E119 Type 2 diabetes mellitus without complications: Secondary | ICD-10-CM

## 2019-06-12 DIAGNOSIS — E785 Hyperlipidemia, unspecified: Secondary | ICD-10-CM

## 2019-06-12 DIAGNOSIS — Z1211 Encounter for screening for malignant neoplasm of colon: Secondary | ICD-10-CM | POA: Diagnosis not present

## 2019-06-12 DIAGNOSIS — I10 Essential (primary) hypertension: Secondary | ICD-10-CM | POA: Diagnosis not present

## 2019-06-12 LAB — POCT GLYCOSYLATED HEMOGLOBIN (HGB A1C): HbA1c, POC (controlled diabetic range): 7.4 % — AB (ref 0.0–7.0)

## 2019-06-12 MED ORDER — LOSARTAN POTASSIUM 100 MG PO TABS: 100.0000 mg | ORAL_TABLET | Freq: Every day | ORAL | 3 refills | Status: DC

## 2019-06-12 NOTE — Progress Notes (Signed)
SUBJECTIVE:  63 y.o. female for annual routine checkup.  Diabetes Medications: metformin Compliance (Modifying factor) - not being able to walk and eating sweets  Hypoglycemic symptoms- no On Aspirin, and on statin Last eye exam: recently Dr. Ronnald Ramp at [place] Last foot exam: will perform at next visit ROS: denies dizziness, diaphoresis, LOC, polyuria, polydipsia  Monitoring Labs and Parameters Last A1C:  Lab Results  Component Value Date   HGBA1C 7.4 (A) 06/12/2019   Hypertension: - Medications: losartan, HCTZ, amlodipine, atenolol - Compliance: yes - Checking BP at home: mo - Denies any SOB, CP, vision changes, LE edema, medication SEs, or symptoms of hypotension - Exercise: unable to exercise   Current Outpatient Medications  Medication Sig Dispense Refill  . albuterol (VENTOLIN HFA) 108 (90 Base) MCG/ACT inhaler INHALE 2 PUFFS BY MOUTH EVERY 6HRS AS NEEDED FOR WHEEZING 18 g 1  . amLODipine (NORVASC) 5 MG tablet Take 1 tablet (5 mg total) by mouth daily. 90 tablet 3  . aspirin 81 MG tablet Take 1 tablet (81 mg total) by mouth daily. 30 tablet 3  . atenolol (TENORMIN) 50 MG tablet TAKE 1 TABLET BY MOUTH EVERY DAY 90 tablet 1  . Blood Glucose Monitoring Suppl (ONE TOUCH ULTRA 2) w/Device KIT Use to check blood sugar each morning then as needed for low blood sugar. 1 each 0  . carboxymethylcellulose (LUBRICANT EYE DROPS) 0.5 % SOLN Place 1 drop into both eyes as needed (dry eyes). 15 mL 5  . docusate sodium (COLACE) 100 MG capsule Take 1 capsule (100 mg total) by mouth 2 (two) times daily. 30 capsule 3  . fluticasone (FLONASE) 50 MCG/ACT nasal spray SPRAY 2 SPRAYS INTO EACH NOSTRIL EVERY DAY 48 mL 3  . glucose blood test strip Use OneTouch Ultra 2 Test Strips to check blood sugar each morning then as needed for low blood sugar. 100 each 12  . hydrochlorothiazide (HYDRODIURIL) 25 MG tablet Take 1 tablet (25 mg total) by mouth at bedtime. 90 tablet 1  . losartan (COZAAR) 100 MG  tablet Take 1 tablet (100 mg total) by mouth at bedtime. 90 tablet 3  . lovastatin (MEVACOR) 20 MG tablet TAKE TWO TABLETS BY MOUTH NIGHTLY AT BEDTIME 180 tablet 3  . metFORMIN (GLUCOPHAGE) 500 MG tablet Take 1 tablet (500 mg total) by mouth 2 (two) times daily with a meal. 180 tablet 3  . ONETOUCH DELICA LANCETS 03O MISC Check Blood Sugar each morning then as needed for low blood sugar. 100 each 12   No current facility-administered medications for this visit.    Allergies: Codeine and Iodine  No LMP recorded. Patient has had a hysterectomy.  ROS:  Feeling well. No dyspnea or chest pain on exertion.  No abdominal pain, change in bowel habits, black or bloody stools.  No urinary tract symptoms.  No neurological complaints.  OBJECTIVE:  The patient appears well, alert, oriented x 3, in no distress. BP 124/74   Pulse 80   Ht 5' 3"  (1.6 m)   Wt 188 lb (85.3 kg)   SpO2 94%   BMI 33.30 kg/m  ENT normal.  Neck supple. No adenopathy or thyromegaly. PERLA. Lungs are clear, good air entry, no wheezes, rhonchi or rales. S1 and S2 normal, no murmurs, regular rate and rhythm. Abdomen soft without tenderness, guarding, mass or organomegaly. Extremities show no edema, normal peripheral pulses. Neurological is normal, no focal findings.   ASSESSMENT:  well woman  PLAN:  Health maintenance Mammogram is scheduled in December  pap smear will be scheduled by patient to be completed Patient also encouraged to have colonoscopy done as she has been encouraged to in the past.  She is agreeable today and will schedule given that she has had continued abdominal cramping  Essential hypertension BP at goal at 124/74.  Continue current medications.  Controlled type 2 diabetes mellitus without complication (HCC) O0I 7.4.  Patient currently on Metformin.  Would like to start SGLT2 therapy with her however she does not want to start this medication today and would like to try to control it better with her  exercise and diet as she has previously done.  Patient counseled on the benefits of starting an SGLT2 and she will read more about it and then when she returns for follow-up visit we will discuss at that time.  Hyperlipidemia LDL 67, at goal.  Continue lovastatin 20   [place] Cheyanna Strick, DO Family Medicine Resident PGY-3

## 2019-06-12 NOTE — Patient Instructions (Addendum)
Thank you for coming to see me today. It was a pleasure! Today we talked about:   I will release your lab results on my chart and call you if we have any concerns about any of them.  Please continue to work on diet and exercise as we discussed for better control of your diabetes.  The medication we would like to go into next is called Jardiance in case she would like to read on this.  It is in a drug class called SGLT2.  Please schedule an appointment to have your pap smear done. Please also schedule a colonoscopy so that we may also better know what is going on with your cramping and abdominal bloating.   Please follow-up with me in 6 months or sooner for the pap smear, or as needed.  If you have any questions or concerns, please do not hesitate to call the office at [phone].  Take Care,   [place] Chamara Dyck, DO   Diet Recommendations for Diabetes  Carbohydrate includes starch, sugar, and fiber.  Of these, only sugar and starch raise blood glucose.  (Fiber is found in fruits, vegetables [especially skin, seeds, and stalks] and whole grains.)   Starchy (carb) foods: Bread, rice, pasta, potatoes, corn, cereal, grits, crackers, bagels, muffins, all baked goods.  (Fruit, milk, and yogurt also have carbohydrate, but most of these foods will not spike your blood sugar as most starchy foods will.)  A few fruits do cause high blood sugars; use small portions of bananas (limit to 1/2 at a time), grapes, watermelon, oranges, and most tropical fruits.   Protein foods: Meat, fish, poultry, eggs, dairy foods, and beans such as pinto and kidney beans (beans also provide carbohydrate).   1. Eat at least REAL 3 meals and 1-2 snacks per day. Never go more than 4-5 hours while awake without eating. Eat breakfast within the first hour of getting up.   2. Limit starchy foods to TWO per meal and ONE per snack. ONE portion of a starchy  food is equal to the following:   - ONE slice of bread (or its equivalent,  such as half of a hamburger bun).   - 1/2 cup of a "scoopable" starchy food such as potatoes or rice.   - 15 grams of Total Carbohydrate as shown on food label.  3. Include at every meal: a protein food, a carb food, and vegetables and/or fruit.   - Obtain twice the volume of veg's as protein or carbohydrate foods for both lunch and dinner.   - Fresh or frozen veg's are best.   - Keep frozen veg's on hand for a quick vegetable serving.

## 2019-06-13 LAB — BASIC METABOLIC PANEL
BUN/Creatinine Ratio: 14 (ref 12–28)
BUN: 10 mg/dL (ref 8–27)
CO2: 23 mmol/L (ref 20–29)
Calcium: 9.8 mg/dL (ref 8.7–10.3)
Chloride: 102 mmol/L (ref 96–106)
Creatinine, Ser: 0.72 mg/dL (ref 0.57–1.00)
GFR calc Af Amer: 102 mL/min/{1.73_m2} (ref >59)
GFR calc non Af Amer: 89 mL/min/{1.73_m2} (ref >59)
Glucose: 181 mg/dL — ABNORMAL HIGH (ref 65–99)
Potassium: 3.8 mmol/L (ref 3.5–5.2)
Sodium: 143 mmol/L (ref 134–144)

## 2019-06-13 LAB — LIPID PANEL
Chol/HDL Ratio: 2.2 ratio (ref 0.0–4.4)
Cholesterol, Total: 148 mg/dL (ref 100–199)
HDL: 66 mg/dL (ref >39)
LDL Chol Calc (NIH): 67 mg/dL (ref 0–99)
Triglycerides: 81 mg/dL (ref 0–149)
VLDL Cholesterol Cal: 15 mg/dL (ref 5–40)

## 2019-06-13 LAB — CBC
Hematocrit: 35.7 % (ref 34.0–46.6)
Hemoglobin: 12 g/dL (ref 11.1–15.9)
MCH: 26.5 pg — ABNORMAL LOW (ref 26.6–33.0)
MCHC: 33.6 g/dL (ref 31.5–35.7)
MCV: 79 fL (ref 79–97)
Platelets: 220 10*3/uL (ref 150–450)
RBC: 4.52 x10E6/uL (ref 3.77–5.28)
RDW: 14.5 % (ref 11.7–15.4)
WBC: 6.2 10*3/uL (ref 3.4–10.8)

## 2019-06-13 LAB — TSH: TSH: 0.957 u[IU]/mL (ref 0.450–4.500)

## 2019-06-15 NOTE — Assessment & Plan Note (Addendum)
A1c 7.4.  Patient currently on Metformin.  Would like to start SGLT2 therapy with her however she does not want to start this medication today and would like to try to control it better with her exercise and diet as she has previously done.  Patient counseled on the benefits of starting an SGLT2 and she will read more about it and then when she returns for follow-up visit we will discuss at that time.  Patient encouraged to follow-up with ophthalmologist

## 2019-06-15 NOTE — Assessment & Plan Note (Signed)
BP at goal at 124/74.  Continue current medications.

## 2019-06-15 NOTE — Assessment & Plan Note (Signed)
LDL 67, at goal.  Continue lovastatin 20

## 2019-07-17 ENCOUNTER — Encounter: Payer: Self-pay | Admitting: Family Medicine

## 2019-07-17 ENCOUNTER — Ambulatory Visit
Admission: RE | Admit: 2019-07-17 | Discharge: 2019-07-17 | Disposition: A | Discharge status: Home / Self Care | Payer: BC Managed Care – PPO | Source: Ambulatory Visit | Attending: Family Medicine | Admitting: Family Medicine

## 2019-07-17 ENCOUNTER — Other Ambulatory Visit: Payer: Self-pay

## 2019-07-17 DIAGNOSIS — Z1231 Encounter for screening mammogram for malignant neoplasm of breast: Secondary | ICD-10-CM

## 2019-07-31 ENCOUNTER — Other Ambulatory Visit: Payer: Self-pay | Admitting: Family Medicine

## 2019-07-31 DIAGNOSIS — J309 Allergic rhinitis, unspecified: Secondary | ICD-10-CM

## 2019-08-31 ENCOUNTER — Other Ambulatory Visit: Payer: Self-pay | Admitting: Family Medicine

## 2019-11-11 ENCOUNTER — Other Ambulatory Visit: Payer: Self-pay | Admitting: Family Medicine

## 2019-12-02 ENCOUNTER — Other Ambulatory Visit: Payer: Self-pay | Admitting: Family Medicine

## 2019-12-25 ENCOUNTER — Other Ambulatory Visit: Payer: Self-pay | Admitting: Family Medicine

## 2019-12-31 ENCOUNTER — Other Ambulatory Visit: Payer: Self-pay | Admitting: Family Medicine

## 2020-01-15 ENCOUNTER — Encounter: Payer: Self-pay | Admitting: Family Medicine

## 2020-01-15 ENCOUNTER — Encounter: Payer: Self-pay | Admitting: Gastroenterology

## 2020-01-18 ENCOUNTER — Ambulatory Visit (AMBULATORY_SURGERY_CENTER): Payer: Self-pay

## 2020-01-18 ENCOUNTER — Other Ambulatory Visit: Payer: Self-pay

## 2020-01-18 VITALS — Ht 63.0 in | Wt 187.0 lb

## 2020-01-18 DIAGNOSIS — Z1211 Encounter for screening for malignant neoplasm of colon: Secondary | ICD-10-CM

## 2020-01-18 NOTE — Progress Notes (Signed)

## 2020-01-19 ENCOUNTER — Encounter: Payer: Self-pay | Admitting: Gastroenterology

## 2020-01-29 ENCOUNTER — Other Ambulatory Visit: Payer: Self-pay

## 2020-01-29 ENCOUNTER — Ambulatory Visit (INDEPENDENT_AMBULATORY_CARE_PROVIDER_SITE_OTHER): Payer: BC Managed Care – PPO | Admitting: Family Medicine

## 2020-01-29 ENCOUNTER — Encounter: Payer: Self-pay | Admitting: Family Medicine

## 2020-01-29 VITALS — BP 126/70 | HR 83 | Ht 63.0 in | Wt 182.5 lb

## 2020-01-29 DIAGNOSIS — K589 Irritable bowel syndrome without diarrhea: Secondary | ICD-10-CM | POA: Diagnosis not present

## 2020-01-29 DIAGNOSIS — E119 Type 2 diabetes mellitus without complications: Secondary | ICD-10-CM

## 2020-01-29 DIAGNOSIS — I10 Essential (primary) hypertension: Secondary | ICD-10-CM | POA: Diagnosis not present

## 2020-01-29 LAB — POCT GLYCOSYLATED HEMOGLOBIN (HGB A1C): HbA1c, POC (controlled diabetic range): 7.5 % — AB (ref 0.0–7.0)

## 2020-01-29 MED ORDER — EMPAGLIFLOZIN 10 MG PO TABS: 10.0000 mg | ORAL_TABLET | Freq: Every day | ORAL | 3 refills | Status: DC

## 2020-01-29 NOTE — Progress Notes (Addendum)
° °  SUBJECTIVE:   CHIEF COMPLAINT / HPI:   SUBJECTIVE:  64 y.o. female for annual routine checkup.  Diabetes Medications: metformin  Hypoglycemic symptoms: no On Aspirin, and on statin Last eye exam:recently Dr. Ronnald Ramp at [place] Last foot exam:will perform today ROS: denies dizziness, diaphoresis, LOC, polyuria, polydipsia  Hypertension: - Medications: losartan, HCTZ, amlodipine, atenolol - Compliance: yes - Checking BP at home: no - Denies any SOB, CP, vision changes, LE edema, medication SEs, or symptoms of hypotension - Exercise: unable to exercise   IBS: She reports that she has continued symptoms of abdominal cramping, bloating and alternating diarrhea and constipation.  She has never had a colonoscopy, and when she had her consultation she did not have a good interaction with the screening nurse and was nervous about the test.  Patient reports that she would like to follow-up with a gastroenterologist as she would like to have her IBS investigated further prior to her having her colonoscopy.  PERTINENT  PMH / PSH: T2DM, HTN, asthma, HLD  OBJECTIVE:  BP 126/70    Pulse 83    Ht 5\' 3"  (1.6 m)    Wt 182 lb 8 oz (82.8 kg)    SpO2 99%    BMI 32.33 kg/m   General: NAD, pleasant Neck: Supple Respiratory:  normal work of breathing Psych: AOx3, appropriate affect  ASSESSMENT/PLAN:   Controlled type 2 diabetes mellitus without complication (HCC) S1U 7.5.  Had lengthy discussion with patient regarding initiating SGLT2.  Patient would like to decrease Metformin as she has trouble taking it twice daily.  After shared decision making with patient she will take Metformin 500 mg once daily given her GI symptoms with this and also will initiate Jardiance 10 mg once daily.  Patient to return in 4 weeks for repeat BMP. New CBG monitor sent for patient to begin checking CBGs daily.  Foot exam performed today.  Essential hypertension BP 126/70 which is within goal.  Continue current  management with losartan, HCTZ, amlodipine, atenolol.   Referral placed to gastroenterology for patient to have further investigation of her IBS as well as a colonoscopy  [place] Chiamaka Latka, DO PGY-3, [place]

## 2020-01-29 NOTE — Patient Instructions (Addendum)
Thank you for coming to see me today. It was a pleasure! Today we talked about:   We will start you on the new medication called Jardiance or empagliflozin.  You should take this once daily.  This is to help with your diabetes but also will help protect your heart and kidneys.  We will be getting labs today and I will release the results on my chart if they are normal and call you if anything is abnormal.  Please do not take this medication on the days you may be experiencing vomiting or diarrhea.  You may continue to take Metformin but if you would like to only take it once a day while you are on this medication then that is acceptable.  I have placed a referral to a gastroenterologist ([facility] doctor).  They will call you to schedule an appointment.  If you do not hear from them within the next 2 weeks then please reach out to our office to let us know.  Please follow-up with our clinic in 1 month or sooner as needed.  If you have any questions or concerns, please do not hesitate to call the office at [phone].  Take Care,   [place] Tami Barren, DO

## 2020-01-30 ENCOUNTER — Encounter: Payer: BC Managed Care – PPO | Admitting: Gastroenterology

## 2020-01-30 LAB — BASIC METABOLIC PANEL
BUN/Creatinine Ratio: 16 (ref 12–28)
BUN: 12 mg/dL (ref 8–27)
CO2: 21 mmol/L (ref 20–29)
Calcium: 9.8 mg/dL (ref 8.7–10.3)
Chloride: 101 mmol/L (ref 96–106)
Creatinine, Ser: 0.77 mg/dL (ref 0.57–1.00)
GFR calc Af Amer: 94 mL/min/{1.73_m2} (ref >59)
GFR calc non Af Amer: 82 mL/min/{1.73_m2} (ref >59)
Glucose: 233 mg/dL — ABNORMAL HIGH (ref 65–99)
Potassium: 3.8 mmol/L (ref 3.5–5.2)
Sodium: 142 mmol/L (ref 134–144)

## 2020-01-30 LAB — CBC
Hematocrit: 38.5 % (ref 34.0–46.6)
Hemoglobin: 12.1 g/dL (ref 11.1–15.9)
MCH: 26.3 pg — ABNORMAL LOW (ref 26.6–33.0)
MCHC: 31.4 g/dL — ABNORMAL LOW (ref 31.5–35.7)
MCV: 84 fL (ref 79–97)
Platelets: 222 10*3/uL (ref 150–450)
RBC: 4.6 x10E6/uL (ref 3.77–5.28)
RDW: 14 % (ref 11.7–15.4)
WBC: 7.1 10*3/uL (ref 3.4–10.8)

## 2020-01-30 MED ORDER — MICROLET NEXT LANCING DEVICE MISC: 1.0000 [IU] | Freq: Once | 0 refills | Status: AC

## 2020-01-30 MED ORDER — GLUCOSE BLOOD VI STRP: ORAL_STRIP | 12 refills | Status: DC

## 2020-01-30 MED ORDER — BAYER CONTOUR LINK 2.4 W/DEVICE KIT: 1.0000 [IU] | PACK | Freq: Once | 0 refills | Status: AC

## 2020-01-30 MED ORDER — MICROLET LANCETS MISC: 1.0000 [IU] | Freq: Every day | 12 refills | Status: DC

## 2020-01-30 NOTE — Assessment & Plan Note (Signed)
BP 126/70 which is within goal.  Continue current management with losartan, HCTZ, amlodipine, atenolol.

## 2020-01-30 NOTE — Assessment & Plan Note (Signed)
A1c 7.5.  Had lengthy discussion with patient regarding initiating SGLT2.  Patient would like to decrease Metformin as she has trouble taking it twice daily.  After shared decision making with patient she will take Metformin 500 mg once daily given her GI symptoms with this and also will initiate Jardiance 10 mg once daily.  Patient to return in 4 weeks for repeat BMP. New CBG monitor sent for patient to begin checking CBGs daily.  Foot exam performed today.

## 2020-02-01 ENCOUNTER — Other Ambulatory Visit: Payer: Self-pay | Admitting: *Deleted

## 2020-02-01 DIAGNOSIS — E119 Type 2 diabetes mellitus without complications: Secondary | ICD-10-CM

## 2020-02-01 MED ORDER — CONTOUR NEXT TEST VI STRP: ORAL_STRIP | 12 refills | Status: DC

## 2020-02-01 MED ORDER — MICROLET NEXT LANCING DEVICE MISC: 1.0000 | Freq: Every day | 0 refills | Status: DC

## 2020-02-01 MED ORDER — CONTOUR NEXT MONITOR W/DEVICE KIT: 1.0000 | PACK | Freq: Every day | 0 refills | Status: DC

## 2020-02-01 NOTE — Telephone Encounter (Signed)
Previous meter sent in by clinic was not covered by patient's insurance.  Will send in [employer] and the supplies.  Shaena Parkerson,CMA

## 2020-02-11 ENCOUNTER — Other Ambulatory Visit: Payer: Self-pay | Admitting: Family Medicine

## 2020-02-16 ENCOUNTER — Encounter: Payer: Self-pay | Admitting: Family Medicine

## 2020-02-17 ENCOUNTER — Other Ambulatory Visit: Payer: Self-pay | Admitting: Family Medicine

## 2020-02-21 ENCOUNTER — Other Ambulatory Visit: Payer: Self-pay | Admitting: Family Medicine

## 2020-02-21 ENCOUNTER — Telehealth: Payer: Self-pay

## 2020-02-21 MED ORDER — MECLIZINE HCL 12.5 MG PO TABS: 25.0000 mg | ORAL_TABLET | Freq: Three times a day (TID) | ORAL | 0 refills | Status: DC | PRN

## 2020-02-21 NOTE — Telephone Encounter (Signed)
Patient calls nurse line requesting refill on meclizine as she has been having issues with vertigo over the last three days. Patient has used meclizine in the past with success.   If appropriate, please send refill to [facility] on [address].   To PCP  Talbot Grumbling, RN

## 2020-02-21 NOTE — Telephone Encounter (Signed)
Called and informed patient of below. Scheduled for next Thursday 7/29 at 0850.   Talbot Grumbling, RN

## 2020-02-21 NOTE — Telephone Encounter (Signed)
Please let her know that I have refilled her meclizine.  It looks like she has not has issues with this for several years now.  Please ask her to schedule at appointment to be seen in the next week or so.  Matilde Haymaker, MD

## 2020-02-22 MED ORDER — ONETOUCH VERIO W/DEVICE KIT: PACK | 0 refills | Status: DC

## 2020-02-22 MED ORDER — ONETOUCH DELICA LANCING DEV MISC: 0 refills | Status: DC

## 2020-02-22 MED ORDER — ONETOUCH DELICA LANCETS 33G MISC: 12 refills | Status: DC

## 2020-02-22 MED ORDER — ONETOUCH VERIO VI STRP: ORAL_STRIP | 12 refills | Status: AC

## 2020-02-22 NOTE — Telephone Encounter (Signed)
Called and spoke with pharmacist. Patient is actually filing [employer] for insurance instead of [employer]. Will resend insurance covered glucometer and supplies.   FYI to PCP  Talbot Grumbling, RN

## 2020-02-27 ENCOUNTER — Encounter: Payer: Self-pay | Admitting: Family Medicine

## 2020-02-27 ENCOUNTER — Other Ambulatory Visit: Payer: Self-pay

## 2020-02-27 ENCOUNTER — Ambulatory Visit (INDEPENDENT_AMBULATORY_CARE_PROVIDER_SITE_OTHER): Payer: BC Managed Care – PPO | Admitting: Family Medicine

## 2020-02-27 DIAGNOSIS — R2689 Other abnormalities of gait and mobility: Secondary | ICD-10-CM | POA: Diagnosis not present

## 2020-02-27 DIAGNOSIS — J0110 Acute frontal sinusitis, unspecified: Secondary | ICD-10-CM

## 2020-02-27 DIAGNOSIS — J019 Acute sinusitis, unspecified: Secondary | ICD-10-CM

## 2020-02-27 DIAGNOSIS — E119 Type 2 diabetes mellitus without complications: Secondary | ICD-10-CM | POA: Diagnosis not present

## 2020-02-27 DIAGNOSIS — K589 Irritable bowel syndrome without diarrhea: Secondary | ICD-10-CM | POA: Insufficient documentation

## 2020-02-27 DIAGNOSIS — K580 Irritable bowel syndrome with diarrhea: Secondary | ICD-10-CM | POA: Diagnosis not present

## 2020-02-27 HISTORY — DX: Acute sinusitis, unspecified: J01.90

## 2020-02-27 HISTORY — DX: Other abnormalities of gait and mobility: R26.89

## 2020-02-27 MED ORDER — MICROLET NEXT LANCING DEVICE MISC: 1.0000 [IU] | Freq: Every day | 0 refills | Status: AC

## 2020-02-27 MED ORDER — GLUCOSE BLOOD VI STRP: ORAL_STRIP | 12 refills | Status: AC

## 2020-02-27 MED ORDER — CONTOUR BLOOD GLUCOSE SYSTEM W/DEVICE KIT: 1.0000 [IU] | PACK | Freq: Every day | 0 refills | Status: AC

## 2020-02-27 MED ORDER — MICROLET LANCETS MISC: 1.0000 [IU] | Freq: Every day | 2 refills | Status: AC

## 2020-02-27 MED ORDER — LOPERAMIDE HCL 2 MG PO CAPS: 2.0000 mg | ORAL_CAPSULE | ORAL | 0 refills | Status: DC | PRN

## 2020-02-27 MED ORDER — AMOXICILLIN-POT CLAVULANATE 875-125 MG PO TABS: 1.0000 | ORAL_TABLET | Freq: Two times a day (BID) | ORAL | 0 refills | Status: DC

## 2020-02-27 NOTE — Assessment & Plan Note (Signed)
We discussed the possibility of a GI referral and decided that it was not necessary at this time as we can continue to treat symptomatically. -Imodium for loose stools -Placed referral for colonoscopy

## 2020-02-27 NOTE — Assessment & Plan Note (Signed)
Based on her symptoms and duration, it is reasonable to treat with antibiotics at this time. -Augmentin 875 twice daily for 10 days

## 2020-02-27 NOTE — Assessment & Plan Note (Signed)
We discussed that Erica Campbell is a very low potential for causing hypoglycemia and that it would be safe to start this even without a glucometer at home. -Start Jardiance 10 mg daily -I will follow up to make sure the correct glucometer is prescribed -Follow-up in 1 month for a kidney function test and A1c

## 2020-02-27 NOTE — Progress Notes (Signed)
SUBJECTIVE:   CHIEF COMPLAINT / HPI:   Sinusitis For the past 10-14 days, she has noticed significant stuffiness and fullness around her nose and above her eyes.  She noted that this began with significantly watery eyes and runny nose about 2 weeks ago.  Those initial symptoms improved but she has been significantly stuffy and had the sensation of fullness since then.  She is able to trace an outline of the area on her face where she feels particular fullness.  Balance impairment She has been experiencing balance impairment for a little over 1 week now.  The symptoms seem to ebb and flow and are not entirely persistent although they have not totally improved at any point in the past week.  She reports that she simply feels off balance at all times.  The sensation seems to have come on slowly over the course of days and she feels like she cannot focus at work or walk or move comfortably because of this persistent balance impairment.  She is previously had an issue similar to this in 2015 with an extensive work-up and an MRI and referral to ENT.  Ultimately, the previous episode resolved after treatment for sinusitis.  Diabetes This point was initially for diabetes follow-up.  She has not started taking her Jardiance since her last visit.  She has not been able to get a glucometer and would like a glucometer before starting new therapy.  IBS She occasionally experiences bloating, constipation and loose stools.  This issue has been ongoing for years.  She has seen Dr. Enid Derry in the past and been prescribed Bentyl and senna as her symptoms have required.  She is now experiencing loose stools without blood for the past 2 weeks and wants to know if she needs a referral to GI or if medication would be helpful.  Screening for colon cancer Last colonoscopy 2005.  It was generally unremarkable but there was no good visualization the past the hepatic flexure.  The right colon could not be  visualized.  PERTINENT  PMH / PSH: Chronic sinus infections, diabetes  OBJECTIVE:   BP (!) 132/70   Pulse 93   Ht 5\' 3"  (1.6 m)   Wt 185 lb 2 oz (84 kg)   SpO2 98%   BMI 32.79 kg/m    General: Alert and cooperative and appears to be in no acute distress HEENT: Some tenderness over the frontal sinuses.  No significant tenderness over the maxillary sinuses.  TMs visualized bilaterally without significant effusions noted.  No purulence.  Oropharynx normal.  Moist mucous membranes. Respiratory: Breathing comfortably on room air.  No respiratory distress Neuro: Cranial nerves grossly intact.  No pronator drift.    ASSESSMENT/PLAN:   Acute sinusitis Based on her symptoms and duration, it is reasonable to treat with antibiotics at this time. -Augmentin 875 twice daily for 10 days  Impairment of balance Ms. Levett is suspicious this that this is related to a sinus infection.  While balance impairment would be an unusual presentation of a sinus infection, it seems reasonable, based on her history, to treat the sinus infection to monitor for signs of improvement.  The differential includes vestibular neuritis, BPPV H, central CNS pathology.  For now, we will treat with a 10-day course of antibiotics and have her follow-up if her symptoms or not significantly improved.  IBS (irritable bowel syndrome) We discussed the possibility of a GI referral and decided that it was not necessary at this time as we  can continue to treat symptomatically. -Imodium for loose stools -Placed referral for colonoscopy  Controlled type 2 diabetes mellitus without complication ([place]) We discussed that Vania Rea is a very low potential for causing hypoglycemia and that it would be safe to start this even without a glucometer at home. -Start Jardiance 10 mg daily -I will follow up to make sure the correct glucometer is prescribed -Follow-up in 1 month for a kidney function test and A1c     Matilde Haymaker, MD [place]

## 2020-02-27 NOTE — Patient Instructions (Addendum)
Vertigo: Sounds like this presentation is very similar to your previous episode of vertigo which improved with treatment for sinus infection.  I have prescribed a 10-day course of Augmentin.  This is an antibiotic that you will need to take twice daily.  If your symptoms do not get better at the completion of your antibiotic course, please come back to clinic and we can do another assessment.  Diabetes: Please start taking your Jardiance.  I think that this medication will be very helpful and improving your diabetes control.  You do not need to wait to get a glucometer to start taking this medication.  I will figure out which glucometer is covered by insurance and have that sent to your pharmacy.  IBS: I have given you a prescription of Imodium which she can use according to the instructions provided.  There is a recommendation for anyone with IBS over the age of 56 to have a colonoscopy.  I think this is further support that we need to have a colonoscopy done.  I think it is reasonable to treat your symptoms as they come on for now without a referral to gastroenterology.  I recommend that we have the colonoscopy done and then have further discussion in clinic if you continue to have poorly controlled IBS symptoms.

## 2020-02-27 NOTE — Assessment & Plan Note (Signed)
Erica Campbell is suspicious this that this is related to a sinus infection.  While balance impairment would be an unusual presentation of a sinus infection, it seems reasonable, based on her history, to treat the sinus infection to monitor for signs of improvement.  The differential includes vestibular neuritis, BPPV H, central CNS pathology.  For now, we will treat with a 10-day course of antibiotics and have her follow-up if her symptoms or not significantly improved.

## 2020-02-29 ENCOUNTER — Ambulatory Visit: Payer: BC Managed Care – PPO | Admitting: Family Medicine

## 2020-02-29 ENCOUNTER — Encounter: Payer: Self-pay | Admitting: Family Medicine
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
## 2020-05-22 ENCOUNTER — Other Ambulatory Visit: Payer: Self-pay | Admitting: Family Medicine

## 2020-05-22 ENCOUNTER — Other Ambulatory Visit: Payer: Self-pay

## 2020-05-22 DIAGNOSIS — J309 Allergic rhinitis, unspecified: Secondary | ICD-10-CM

## 2020-05-23 ENCOUNTER — Encounter: Payer: Self-pay | Admitting: Family Medicine

## 2020-05-24 MED ORDER — FLUTICASONE PROPIONATE 50 MCG/ACT NA SUSP: NASAL | 3 refills | Status: DC

## 2020-06-05 ENCOUNTER — Other Ambulatory Visit: Payer: Self-pay

## 2020-06-05 ENCOUNTER — Ambulatory Visit (INDEPENDENT_AMBULATORY_CARE_PROVIDER_SITE_OTHER): Payer: BC Managed Care – PPO

## 2020-06-05 DIAGNOSIS — Z23 Encounter for immunization: Secondary | ICD-10-CM | POA: Diagnosis not present

## 2020-06-05 NOTE — Progress Notes (Signed)
Patient presents to nurse clinic for flu vaccination. Administered in RD, site unremarkable, tolerated injection well.   Britnee Mcdevitt C Makahla Kiser, RN  

## 2020-06-10 ENCOUNTER — Other Ambulatory Visit: Payer: Self-pay

## 2020-06-10 MED ORDER — ATENOLOL 50 MG PO TABS
50.0000 mg | ORAL_TABLET | Freq: Every day | ORAL | 1 refills | Status: DC
Start: 2020-06-10 — End: 2020-12-31

## 2020-07-02 ENCOUNTER — Ambulatory Visit: Payer: BC Managed Care – PPO

## 2020-07-05 ENCOUNTER — Other Ambulatory Visit: Payer: Self-pay

## 2020-07-05 ENCOUNTER — Ambulatory Visit (INDEPENDENT_AMBULATORY_CARE_PROVIDER_SITE_OTHER): Payer: BC Managed Care – PPO | Admitting: *Deleted

## 2020-07-05 DIAGNOSIS — Z23 Encounter for immunization: Secondary | ICD-10-CM | POA: Diagnosis not present

## 2020-07-05 NOTE — Progress Notes (Signed)
Pt is here for her 3rd covid vaccine today.  Had no reaction with the others and it has been at least 6 months.   Vaccine given with no issues.   Provided pt with 2nd card, validating 3rd dose.  Christen Bame, CMA

## 2020-08-17 ENCOUNTER — Encounter: Payer: Self-pay | Admitting: Family Medicine

## 2020-08-20 MED ORDER — DICYCLOMINE HCL 10 MG PO CAPS: 10.0000 mg | ORAL_CAPSULE | Freq: Three times a day (TID) | ORAL | 0 refills | Status: DC | PRN

## 2020-08-26 ENCOUNTER — Other Ambulatory Visit: Payer: Self-pay

## 2020-08-27 MED ORDER — LOSARTAN POTASSIUM 100 MG PO TABS
100.0000 mg | ORAL_TABLET | Freq: Every day | ORAL | 3 refills | Status: DC
Start: 2020-08-27 — End: 2021-11-28

## 2020-09-02 ENCOUNTER — Other Ambulatory Visit: Payer: Self-pay | Admitting: Family Medicine

## 2020-09-02 DIAGNOSIS — J309 Allergic rhinitis, unspecified: Secondary | ICD-10-CM

## 2020-09-02 NOTE — Telephone Encounter (Signed)
Pt walked in to request refill of:  Name of Medication(s):  Albuterol and Medformin Last date of OV:  07/05/20 Pharmacy:    Will route refill request to Clinic RN.  Discussed with patient policy to call pharmacy for future refills.  Also, discussed refills may take up to 48 hours to approve or deny.  Creig Hines

## 2020-09-03 ENCOUNTER — Encounter: Payer: Self-pay | Admitting: Family Medicine

## 2020-09-03 MED ORDER — ALBUTEROL SULFATE HFA 108 (90 BASE) MCG/ACT IN AERS: INHALATION_SPRAY | RESPIRATORY_TRACT | 1 refills | Status: DC

## 2020-09-03 MED ORDER — METFORMIN HCL 500 MG PO TABS
500.0000 mg | ORAL_TABLET | Freq: Two times a day (BID) | ORAL | 3 refills | Status: DC
Start: 2020-09-03 — End: 2021-10-06

## 2020-09-04 NOTE — Progress Notes (Signed)
    SUBJECTIVE:   CHIEF COMPLAINT / HPI:   Upper Respiratory Infection: patient comes to clinic today reporting since Sunday 1/30 she has "felt horrible" and felt worse on 1/31.  She reports sore throat, hoarseness, mild headache, low-grade fever (99 F), fatigue, body aches especially at night in her legs, loose stool, and chest congestion.  She denies nasal congestion, shortness of breath, nausea, vomiting, rashes, and change in her sense of taste/smell.  She has been using Motrin and Tylenol at night, even use Benadryl the other night to help her sleep.  Has been using Mucinex, Vicks VapoRub, and Hall's cough drops.  Patient received a call 1/31 that a coworker at her job had tested positive for Covid.  Patient had a rapid Covid test at home on 2/1 (Tuesday) that was negative.  She is fully vaccinated and boosted against COVID and the flu. She is fully vaccinated against COVID, Boosted 07/05/2020.   PERTINENT  PMH / PSH:  HLD, Hx Malignant neoplasm of breast, T2DM, IBS  OBJECTIVE:   BP 124/78   Pulse 82   SpO2 98%    Physical exam: General: Nontoxic-appearing HEENT: Normocephalic, atraumatic, EOMI, PERRLA, ears with normal-appearing tympanic membranes without bulging/erythema bilaterally, patent nares with only mild edematous/erythematous turbinates, no pharyngeal erythema or tonsillar exudates, no anterior cervical lymphadenopathy appreciated Respiratory: CTA bilaterally, comfortable moving air well, dry cough appreciated during exam Cardio: RRR, S1-S2 present, no murmurs appreciated Extremities: 1+ nonpitting edema appreciated to bilateral lower extremities  ASSESSMENT/PLAN:   Viral upper respiratory tract infection Patient with signs/symptoms concerning for viral upper respiratory tract infection.  Patient currently on day 4 of illness.  Patient had Covid test performed at home which was negative.  Patient vaccinated/boosted against COVID as well as flu.  Patient given the following  instructions: 1. Take 1 tablet of ibuprofen 200 mg with 1 tablet of Tylenol 500 mg 3 times daily (every 8 hours) to help decrease inflammation, agitation in your chest/lungs, fevers, pains, and body aches. 2.  Use your albuterol inhaler 2 puffs every 4-6 hours for the next 3 days to help open up your lungs, prevent pneumonia. 3.  Use a humidifier in your home, you can continue to use the Vicks VapoRub, and Hall's cough drops as needed.  You can continue to use the Mucinex as you would like, however drinking water and staying hydrated has been proven to be more effective at decreasing mucus production.  You can even take 1 tablespoon of honey 3 times daily to suppress her cough, however be sure to continue to monitor your blood sugar. 4.  Should you develop worsening symptoms such as shortness of breath, fevers that do not reduce with Tylenol/Motrin, or inability to stay hydrated (vomiting, diarrhea), please seek emergency medical help.     [place]

## 2020-09-05 ENCOUNTER — Ambulatory Visit (INDEPENDENT_AMBULATORY_CARE_PROVIDER_SITE_OTHER): Payer: BC Managed Care – PPO | Admitting: Family Medicine

## 2020-09-05 ENCOUNTER — Other Ambulatory Visit: Payer: Self-pay

## 2020-09-05 VITALS — BP 124/78 | HR 82

## 2020-09-05 DIAGNOSIS — J069 Acute upper respiratory infection, unspecified: Secondary | ICD-10-CM

## 2020-09-05 HISTORY — DX: Acute upper respiratory infection, unspecified: J06.9

## 2020-09-05 NOTE — Assessment & Plan Note (Signed)
Patient with signs/symptoms concerning for viral upper respiratory tract infection.  Patient currently on day 4 of illness.  Patient had Covid test performed at home which was negative.  Patient vaccinated/boosted against COVID as well as flu.  Patient given the following instructions: 1. Take 1 tablet of ibuprofen 200 mg with 1 tablet of Tylenol 500 mg 3 times daily (every 8 hours) to help decrease inflammation, agitation in your chest/lungs, fevers, pains, and body aches. 2.  Use your albuterol inhaler 2 puffs every 4-6 hours for the next 3 days to help open up your lungs, prevent pneumonia. 3.  Use a humidifier in your home, you can continue to use the Vicks VapoRub, and Hall's cough drops as needed.  You can continue to use the Mucinex as you would like, however drinking water and staying hydrated has been proven to be more effective at decreasing mucus production.  You can even take 1 tablespoon of honey 3 times daily to suppress her cough, however be sure to continue to monitor your blood sugar. 4.  Should you develop worsening symptoms such as shortness of breath, fevers that do not reduce with Tylenol/Motrin, or inability to stay hydrated (vomiting, diarrhea), please seek emergency medical help.

## 2020-09-05 NOTE — Patient Instructions (Signed)
Thank you for coming in to see [place] today! Please see below to review our plan for today's visit:  1. Take 1 tablet of ibuprofen 200 mg with 1 tablet of Tylenol 500 mg 3 times daily (every 8 hours) to help decrease inflammation, agitation in your chest/lungs, fevers, pains, and body aches. 2.  Use your albuterol inhaler 2 puffs every 4-6 hours for the next 3 days to help open up your lungs, prevent pneumonia. 3.  Use a humidifier in your home, you can continue to use the Vicks VapoRub, and Hall's cough drops as needed.  You can continue to use the Mucinex as you would like, however drinking water and staying hydrated has been proven to be more effective at decreasing mucus production.  You can even take 1 tablespoon of honey 3 times daily to suppress her cough, however be sure to continue to monitor your blood sugar. 4.  Should you develop worsening symptoms such as shortness of breath, fevers that do not reduce with Tylenol/Motrin, or inability to stay hydrated (vomiting, diarrhea), please seek emergency medical help.  Please call the clinic at [phone] if your symptoms worsen or you have any concerns. It was our pleasure to serve you!   Dr. Milus Banister [facility]

## 2020-09-08 ENCOUNTER — Encounter: Payer: Self-pay | Admitting: Family Medicine

## 2020-09-08 LAB — SARS-COV-2, NAA 2 DAY TAT

## 2020-09-08 LAB — NOVEL CORONAVIRUS, NAA: SARS-CoV-2, NAA: DETECTED — AB

## 2020-09-09 ENCOUNTER — Encounter: Payer: Self-pay | Admitting: Family Medicine

## 2020-09-09 ENCOUNTER — Other Ambulatory Visit: Payer: Self-pay | Admitting: Adult Health

## 2020-09-09 ENCOUNTER — Encounter: Payer: Self-pay | Admitting: Adult Health

## 2020-09-09 DIAGNOSIS — U071 COVID-19: Secondary | ICD-10-CM

## 2020-09-09 NOTE — Progress Notes (Signed)
I connected by phone with Erica Campbell on 09/09/2020 at 1:31 PM to discuss the potential use of a new treatment for mild to moderate COVID-19 viral infection in non-hospitalized patients.  This patient is a 65 y.o. female that meets the FDA criteria for Emergency Use Authorization of COVID monoclonal antibody sotrovimab.  Has a (+) direct SARS-CoV-2 viral test result  Has mild or moderate COVID-19   Is NOT hospitalized due to COVID-19  Is within 10 days of symptom onset  Has at least one of the high risk factor(s) for progression to severe COVID-19 and/or hospitalization as defined in EUA.  Specific high risk criteria : Older age (>/= 66 yo), BMI > 25, Diabetes and Cardiovascular disease or hypertension   I have spoken and communicated the following to the patient or parent/caregiver regarding COVID monoclonal antibody treatment:  1. FDA has authorized the emergency use for the treatment of mild to moderate COVID-19 in adults and pediatric patients with positive results of direct SARS-CoV-2 viral testing who are 52 years of age and older weighing at least 40 kg, and who are at high risk for progressing to severe COVID-19 and/or hospitalization.  2. The significant known and potential risks and benefits of COVID monoclonal antibody, and the extent to which such potential risks and benefits are unknown.  3. Information on available alternative treatments and the risks and benefits of those alternatives, including clinical trials.  4. Patients treated with COVID monoclonal antibody should continue to self-isolate and use infection control measures (e.g., wear mask, isolate, social distance, avoid sharing personal items, clean and disinfect "high touch" surfaces, and frequent handwashing) according to CDC guidelines.   5. The patient or parent/caregiver has the option to accept or refuse COVID monoclonal antibody treatment.  After reviewing this information with the patient, the patient has  agreed to receive one of the available covid 19 monoclonal antibodies and will be provided an appropriate fact sheet prior to infusion. Scot Dock, NP 09/09/2020 1:31 PM

## 2020-09-10 ENCOUNTER — Encounter: Payer: Self-pay | Admitting: Family Medicine

## 2020-09-10 ENCOUNTER — Ambulatory Visit (HOSPITAL_COMMUNITY): Payer: BC Managed Care – PPO

## 2020-09-26 ENCOUNTER — Other Ambulatory Visit: Payer: Self-pay

## 2020-09-27 MED ORDER — HYDROCHLOROTHIAZIDE 25 MG PO TABS: ORAL_TABLET | ORAL | 1 refills | Status: DC

## 2020-10-16 ENCOUNTER — Encounter: Payer: Self-pay | Admitting: Family Medicine

## 2020-10-22 ENCOUNTER — Other Ambulatory Visit: Payer: Self-pay | Admitting: Family Medicine

## 2020-10-29 ENCOUNTER — Telehealth: Payer: Self-pay | Admitting: Family Medicine

## 2020-10-29 NOTE — Telephone Encounter (Signed)
Patient walked in with FMLA paperwork given to her by her work to be completed by Dr. 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 Offered patient appointment (Last DOS:09/05/2020) to come in but she stated she has her hands tied with caring for her father Erica Campbell is in the hospital and caring for her husband in and out of rehab, she doesn't have the time to come in. She stated she could do a Virtual visit. I told her I would ask the doctor/nurse.   Papers are being placed in the [facility] teams folder, if patient doesn't need appointment she is wanting to be called when forms are ready to be picked up. Thanks! [phone]

## 2020-10-30 NOTE — Telephone Encounter (Signed)
Routed to PCP. Erica Campbell, CMA  

## 2020-10-30 NOTE — Addendum Note (Signed)
Addended by: Grant Ruts on: 10/30/2020 06:09 PM   Modules accepted: Orders

## 2020-10-30 NOTE — Telephone Encounter (Signed)
I would like Mrs. Gemmer to be seen in clinic prior to filling out this FMLA paperwork.  Matilde Haymaker, MD

## 2020-10-31 NOTE — Telephone Encounter (Signed)
Attempted to reach pt no answer. LVM for pt to call the office to make appt with Dr. Pilar Plate. He would like to see her before filling out any paperwork. Salvatore Marvel, CMA

## 2020-10-31 NOTE — Telephone Encounter (Signed)
2nd attempted to reach pt to make appt with Dr. Pilar Plate . No answer Lvm for pt to call office to make that appt. Salvatore Marvel, CMA

## 2020-11-06 ENCOUNTER — Encounter: Payer: Self-pay | Admitting: Family Medicine

## 2020-11-06 ENCOUNTER — Other Ambulatory Visit: Payer: Self-pay

## 2020-11-06 ENCOUNTER — Ambulatory Visit: Payer: BC Managed Care – PPO | Admitting: Family Medicine

## 2020-11-06 VITALS — BP 139/60 | HR 95 | Ht 63.0 in | Wt 176.6 lb

## 2020-11-06 DIAGNOSIS — E119 Type 2 diabetes mellitus without complications: Secondary | ICD-10-CM | POA: Diagnosis not present

## 2020-11-06 LAB — POCT GLYCOSYLATED HEMOGLOBIN (HGB A1C): HbA1c, POC (controlled diabetic range): 7 % (ref 0.0–7.0)

## 2020-11-06 NOTE — Progress Notes (Signed)
Erica Campbell came in to clinic today to discuss FMLA paperwork.  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  I have provided additional information in the chart of her husband, Erica Campbell.  She requested an A1c at the end of her visit but was aware we would not have time to discuss.  After obtaining the A1c, she was encouraged to return when she is able for a full diabetes visit.  Erica Haymaker, MD

## 2020-12-03 ENCOUNTER — Other Ambulatory Visit: Payer: Self-pay

## 2020-12-03 ENCOUNTER — Ambulatory Visit: Payer: BC Managed Care – PPO | Admitting: Physical Therapy

## 2020-12-03 ENCOUNTER — Encounter: Payer: Self-pay | Admitting: Physical Therapy

## 2020-12-03 ENCOUNTER — Ambulatory Visit: Payer: BC Managed Care – PPO | Attending: Family Medicine | Admitting: Physical Therapy

## 2020-12-03 DIAGNOSIS — M25652 Stiffness of left hip, not elsewhere classified: Secondary | ICD-10-CM | POA: Diagnosis not present

## 2020-12-03 DIAGNOSIS — R2689 Other abnormalities of gait and mobility: Secondary | ICD-10-CM | POA: Diagnosis not present

## 2020-12-03 DIAGNOSIS — M6281 Muscle weakness (generalized): Secondary | ICD-10-CM | POA: Diagnosis not present

## 2020-12-03 DIAGNOSIS — M5442 Lumbago with sciatica, left side: Secondary | ICD-10-CM

## 2020-12-03 DIAGNOSIS — G8929 Other chronic pain: Secondary | ICD-10-CM

## 2020-12-03 DIAGNOSIS — M25612 Stiffness of left shoulder, not elsewhere classified: Secondary | ICD-10-CM
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
## 2020-12-03 NOTE — Patient Instructions (Signed)
Access Code: [email or web address] URL: [email or web address] Date: 12/03/2020 Prepared by: Estill Bamberg April Thurnell Garbe  Exercises Seated Figure 4 Piriformis Stretch - 1 x daily - 7 x weekly - 3 sets - 20-30 sec hold Standing 'L' Stretch at Counter - 1 x daily - 7 x weekly - 3 sets - 20-30 sec hold Seated Quadratus Lumborum Stretch with Forward Bend - 1 x daily - 7 x weekly - 3 sets - 20-30 sec hold Seated Gentle Upper Trapezius Stretch - 1 x daily - 7 x weekly - 3 sets - 20-30 sec hold

## 2020-12-18 ENCOUNTER — Other Ambulatory Visit: Payer: Self-pay

## 2020-12-18 ENCOUNTER — Ambulatory Visit: Payer: BC Managed Care – PPO | Admitting: Physical Therapy

## 2020-12-18 DIAGNOSIS — M25652 Stiffness of left hip, not elsewhere classified: Secondary | ICD-10-CM

## 2020-12-18 DIAGNOSIS — M5442 Lumbago with sciatica, left side: Secondary | ICD-10-CM

## 2020-12-18 DIAGNOSIS — R2689 Other abnormalities of gait and mobility: Secondary | ICD-10-CM

## 2020-12-18 DIAGNOSIS — M6281 Muscle weakness (generalized): Secondary | ICD-10-CM

## 2020-12-18 DIAGNOSIS — G8929 Other chronic pain: Secondary | ICD-10-CM

## 2020-12-18 DIAGNOSIS — M25612 Stiffness of left shoulder, not elsewhere classified: Secondary | ICD-10-CM
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
## 2020-12-25 ENCOUNTER — Ambulatory Visit: Payer: BC Managed Care – PPO | Admitting: Physical Therapy

## 2020-12-31 ENCOUNTER — Other Ambulatory Visit: Payer: Self-pay

## 2020-12-31 MED ORDER — ATENOLOL 50 MG PO TABS: 50.0000 mg | ORAL_TABLET | Freq: Every day | ORAL | 1 refills | Status: DC

## 2021-01-01 ENCOUNTER — Other Ambulatory Visit: Payer: Self-pay

## 2021-01-01 ENCOUNTER — Ambulatory Visit: Payer: BC Managed Care – PPO | Attending: Family Medicine | Admitting: Physical Therapy

## 2021-01-01 DIAGNOSIS — M5442 Lumbago with sciatica, left side: Secondary | ICD-10-CM | POA: Insufficient documentation

## 2021-01-01 DIAGNOSIS — M25652 Stiffness of left hip, not elsewhere classified: Secondary | ICD-10-CM | POA: Diagnosis not present

## 2021-01-01 DIAGNOSIS — G8929 Other chronic pain: Secondary | ICD-10-CM

## 2021-01-01 DIAGNOSIS — M25612 Stiffness of left shoulder, not elsewhere classified: Secondary | ICD-10-CM | POA: Diagnosis not present

## 2021-01-01 DIAGNOSIS — M6281 Muscle weakness (generalized): Secondary | ICD-10-CM | POA: Diagnosis not present

## 2021-01-01 DIAGNOSIS — R2689 Other abnormalities of gait and mobility: Secondary | ICD-10-CM | POA: Diagnosis not present
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
## 2021-01-08 ENCOUNTER — Ambulatory Visit: Payer: BC Managed Care – PPO | Admitting: Physical Therapy

## 2021-01-08 ENCOUNTER — Other Ambulatory Visit: Payer: Self-pay

## 2021-01-08 DIAGNOSIS — M25652 Stiffness of left hip, not elsewhere classified: Secondary | ICD-10-CM

## 2021-01-08 DIAGNOSIS — R2689 Other abnormalities of gait and mobility: Secondary | ICD-10-CM

## 2021-01-08 DIAGNOSIS — M5442 Lumbago with sciatica, left side: Secondary | ICD-10-CM

## 2021-01-08 DIAGNOSIS — M6281 Muscle weakness (generalized): Secondary | ICD-10-CM

## 2021-01-08 DIAGNOSIS — M25612 Stiffness of left shoulder, not elsewhere classified: Secondary | ICD-10-CM

## 2021-01-08 DIAGNOSIS — G8929 Other chronic pain: Secondary | ICD-10-CM | POA: Diagnosis not present
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
## 2021-01-15 ENCOUNTER — Ambulatory Visit: Payer: BC Managed Care – PPO

## 2021-01-16 ENCOUNTER — Encounter: Payer: BC Managed Care – PPO | Admitting: Physical Therapy

## 2021-01-22 ENCOUNTER — Other Ambulatory Visit: Payer: Self-pay

## 2021-01-22 ENCOUNTER — Encounter: Payer: Self-pay | Admitting: Physical Therapy

## 2021-01-22 ENCOUNTER — Ambulatory Visit: Payer: BC Managed Care – PPO | Admitting: Physical Therapy

## 2021-01-22 DIAGNOSIS — M6281 Muscle weakness (generalized): Secondary | ICD-10-CM

## 2021-01-22 DIAGNOSIS — M25652 Stiffness of left hip, not elsewhere classified: Secondary | ICD-10-CM

## 2021-01-22 DIAGNOSIS — G8929 Other chronic pain: Secondary | ICD-10-CM

## 2021-01-22 DIAGNOSIS — R2689 Other abnormalities of gait and mobility: Secondary | ICD-10-CM

## 2021-01-22 DIAGNOSIS — M25612 Stiffness of left shoulder, not elsewhere classified: Secondary | ICD-10-CM
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
## 2021-01-27 ENCOUNTER — Other Ambulatory Visit: Payer: Self-pay

## 2021-01-27 MED ORDER — LOVASTATIN 20 MG PO TABS: ORAL_TABLET | ORAL | 3 refills | Status: DC

## 2021-01-29 ENCOUNTER — Ambulatory Visit: Payer: BC Managed Care – PPO

## 2021-01-30 ENCOUNTER — Other Ambulatory Visit: Payer: Self-pay

## 2021-01-31 MED ORDER — EMPAGLIFLOZIN 10 MG PO TABS: 10.0000 mg | ORAL_TABLET | Freq: Every day | ORAL | 3 refills | Status: DC

## 2021-02-04 ENCOUNTER — Other Ambulatory Visit: Payer: Self-pay

## 2021-02-04 ENCOUNTER — Encounter: Payer: Self-pay | Admitting: Family Medicine

## 2021-02-04 ENCOUNTER — Ambulatory Visit (INDEPENDENT_AMBULATORY_CARE_PROVIDER_SITE_OTHER): Payer: BC Managed Care – PPO | Admitting: Family Medicine

## 2021-02-04 VITALS — BP 158/80 | HR 87 | Wt 176.0 lb

## 2021-02-04 DIAGNOSIS — R002 Palpitations: Secondary | ICD-10-CM | POA: Diagnosis not present

## 2021-02-04 DIAGNOSIS — I10 Essential (primary) hypertension: Secondary | ICD-10-CM

## 2021-02-04 DIAGNOSIS — F419 Anxiety disorder, unspecified: Secondary | ICD-10-CM

## 2021-02-04 DIAGNOSIS — E785 Hyperlipidemia, unspecified: Secondary | ICD-10-CM

## 2021-02-04 DIAGNOSIS — E119 Type 2 diabetes mellitus without complications: Secondary | ICD-10-CM | POA: Diagnosis not present

## 2021-02-04 DIAGNOSIS — Z862 Personal history of diseases of the blood and blood-forming organs and certain disorders involving the immune mechanism: Secondary | ICD-10-CM

## 2021-02-04 HISTORY — DX: Palpitations: R00.2

## 2021-02-04 LAB — POCT GLYCOSYLATED HEMOGLOBIN (HGB A1C): HbA1c, POC (controlled diabetic range): 7 % (ref 0.0–7.0)

## 2021-02-04 MED ORDER — GABAPENTIN 100 MG PO CAPS: 100.0000 mg | ORAL_CAPSULE | Freq: Three times a day (TID) | ORAL | 0 refills | Status: DC

## 2021-02-04 NOTE — Assessment & Plan Note (Signed)
Checking lipid panel today.  Patient currently on lovastatin 20 mg daily

## 2021-02-04 NOTE — Progress Notes (Signed)
    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  She takes care of her husband primarily but also helps take care of her father (and essentially her mother as well).  For the last week she has been having palpitations and an increase in her GI symptoms, and thinks that this is really related to a lot of the stress and anxiety she has been dealing with.  She and her mother have been having large arguments with yelling, which she states is expected and she can understand as they are both scared and unsure of really what to do.   She has had this of anxiety before back in 2020 and was given a short-term course of medications that helped her with her anxiety.   PERTINENT  PMH / PSH: Reviewed  OBJECTIVE:   BP (!) 158/80   Pulse 87   Wt 176 lb (79.8 kg)   SpO2 99%   BMI 31.18 kg/m   General: anxious, well-appearing, well-nourished Respiratory: No respiratory distress, breathing comfortably, able to speak in full sentences Skin: warm and dry, no rashes noted on exposed skin Psych: Appropriate affect and mood, tearful in the room when discussing stressful situations   ASSESSMENT/PLAN:   Anxiety Anxiety with heart palpitations, related to stressful home factors including being a caretaker.  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  Very tearful on examination and quite obviously anxious.  We will do a short-term course of gabapentin as patient was previously on Valium which we do not prefer at this time. -Gabapentin 100 mg 3 times daily x14 days -Follow-up in 2 weeks on 7/19 -Therapy resources given and encouraged  Controlled type 2 diabetes mellitus without complication (HCC) O5F currently 7.0, which is unchanged from last check.  Patient stable on Jardiance 10 mg and  metformin 500 mg twice daily - Continue Jardiance and metformin  Essential hypertension BP today 158/80, likely related to patient's current anxiety and stress level as patient was tearful in the room.  Patient's current medications include amlodipine 5 mg, atenolol 50 mg daily, losartan 100 mg daily, HCTZ 25 mg daily. -Continue home medications -We will follow-up in 2 weeks for further monitoring   Heart palpitations Heart palpitations x1 week, likely related to anxiety/anxious state.  Patient's last TSH was checked in 2020, cannot rule out that this is not contributing to patient's overall clinical picture.  Patient does have a history of anemia, which could always contribute to palpitations as well. -Check TSH -We will check CBC to monitor for anemia  Hyperlipidemia Checking lipid panel today.  Patient currently on lovastatin 20 mg daily     Erica Campbell, May

## 2021-02-04 NOTE — Assessment & Plan Note (Signed)
Heart palpitations x1 week, likely related to anxiety/anxious state.  Patient's last TSH was checked in 2020, cannot rule out that this is not contributing to patient's overall clinical picture.  Patient does have a history of anemia, which could always contribute to palpitations as well. -Check TSH -We will check CBC to monitor for anemia

## 2021-02-04 NOTE — Assessment & Plan Note (Signed)
Anxiety with heart palpitations, related to stressful home factors including being a caretaker.  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  Very tearful on examination and quite obviously anxious.  We will do a short-term course of gabapentin as patient was previously on Valium which we do not prefer at this time. -Gabapentin 100 mg 3 times daily x14 days -Follow-up in 2 weeks on 7/19 -Therapy resources given and encouraged

## 2021-02-04 NOTE — Patient Instructions (Addendum)
For anxiety and sending in a medication called gabapentin to be taken 3 times a day and we will do a short course to see if this helps with your anxiety.  I want you to to follow back up in the next 2 weeks regarding your blood pressure as well as does anxiety.  I do think you would benefit from talking with somebody, and let us me or someone else.  I will give you the resources for other therapy options below.  I am also can a send in referral to our chronic care management team to see if they can reach out and help you in any way.  For your labs, please come back Friday to have them drawn.      Therapy and Counseling Resources Most providers on this list will take Medicaid. Patients with commercial insurance or Medicare should contact their insurance company to get a list of in network providers.  [facility]:Psychiatry and Counseling [address] [facility] [place]. [place], [place] [address] [place], [place], [place] [address]      [facility] [address]  [place], [place] [address] [phone]  [place] [address] [place]  [place], [place] [address]       [phone]     [facility] (virtual only) [phone]  Jinny Blossom [facility] [address], [place], [place]  [facility]:  [place]. [place] [phone]  [facility]:  [place] [address] [place] [phone]  [facility] (under & uninsured) [address], [place] [phone]    [email or web address]    [place] [address]  [place]    [phone]  [facility] [place]     Phone:  [phone]     [place]  [place] [address]      [place], [place] ext [place]: [place], [place], [place]   [place] ([place] therapist) [email or web address]  [place] [address]   [place] [address]    [phone]    [facility]   [address],  [place], [place]   [place]  [place]  [facility]  [address] [place], [place]        [phone]  Open Access/Walk In Clinic under & uninsured  [facility]  [address] [place], [place] Crisis [phone]  [facility] [place],  ([place])   [place], [place]: [phone]) 8:30 - 12; 1 - 2:30  [facility] the [place],  [place], [place]    ([phone]):8:30 - 12; 2 - 3PM  [facility] [employer],  [address]  [place]; [phone]):   Mon - Fri 8 AM - 5 PM  [facility] [place]  MWF 12:30 to 3:00 or call to schedule an appointment  [phone]  Specific Provider options Psychology Today  [email or web address]/us click on find a therapist  enter your zip code left side and select or tailor a therapist for your specific need.  [facility] Provider Directory [email or web address]  (Medicaid)   Follow all drop down to find a provider  [place] or [email or web address] [address], [place], [place] Recovery support and educational   24- Hour Availability:   [facility]  [address] [place], [place] Crisis [phone]  [facility] [employer] [phone]  [place]  [phone]   [place]  [phone] (after hours)  Therapeutic Alternative/Mobile  Crisis   [phone]  [place] National Suicide Hotline  9781462338 Diamantina Monks)  Call 911 or go to emergency room  [facility]  [phone]);  [place] and [employer]  [phone]); [place], [place], [place], [place], [place], [place], [place]

## 2021-02-04 NOTE — Assessment & Plan Note (Signed)
A1c currently 7.0, which is unchanged from last check.  Patient stable on Jardiance 10 mg and metformin 500 mg twice daily - Continue Jardiance and metformin

## 2021-02-04 NOTE — Assessment & Plan Note (Signed)
BP today 158/80, likely related to patient's current anxiety and stress level as patient was tearful in the room.  Patient's current medications include amlodipine 5 mg, atenolol 50 mg daily, losartan 100 mg daily, HCTZ 25 mg daily. -Continue home medications -We will follow-up in 2 weeks for further monitoring

## 2021-02-05 ENCOUNTER — Telehealth: Payer: Self-pay | Admitting: *Deleted

## 2021-02-05 ENCOUNTER — Ambulatory Visit: Payer: BC Managed Care – PPO | Attending: Family Medicine | Admitting: Physical Therapy

## 2021-02-05 DIAGNOSIS — M25612 Stiffness of left shoulder, not elsewhere classified: Secondary | ICD-10-CM | POA: Insufficient documentation

## 2021-02-05 DIAGNOSIS — R2689 Other abnormalities of gait and mobility: Secondary | ICD-10-CM | POA: Diagnosis not present

## 2021-02-05 DIAGNOSIS — M25652 Stiffness of left hip, not elsewhere classified: Secondary | ICD-10-CM | POA: Diagnosis present

## 2021-02-05 DIAGNOSIS — G8929 Other chronic pain: Secondary | ICD-10-CM | POA: Diagnosis present

## 2021-02-05 DIAGNOSIS — M5442 Lumbago with sciatica, left side: Secondary | ICD-10-CM | POA: Insufficient documentation

## 2021-02-05 DIAGNOSIS — M6281 Muscle weakness (generalized): Secondary | ICD-10-CM | POA: Insufficient documentation
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
## 2021-02-05 NOTE — Telephone Encounter (Signed)
   Telephone encounter was:  Unsuccessful.  02/05/2021 Name: Erica Campbell MRN: [id number] DOB: 12-09-1955  Unsuccessful outbound call made today to assist with:  Caregiver Stress  Outreach Attempt:  1st Attempt  A HIPAA compliant voice message was left requesting a return call.  Instructed patient to call back at   Instructed patient to call back at [phone]  at their earliest convenience. .  [place], Care Management  [phone] [address] , [place] [address] Email : [email or web address]

## 2021-02-12 ENCOUNTER — Ambulatory Visit: Payer: BC Managed Care – PPO | Admitting: Physical Therapy

## 2021-02-12 ENCOUNTER — Encounter: Payer: Self-pay | Admitting: Family Medicine

## 2021-02-14 ENCOUNTER — Other Ambulatory Visit: Payer: Self-pay | Admitting: Family Medicine

## 2021-02-14 DIAGNOSIS — E119 Type 2 diabetes mellitus without complications: Secondary | ICD-10-CM

## 2021-02-14 DIAGNOSIS — Z853 Personal history of malignant neoplasm of breast: Secondary | ICD-10-CM

## 2021-02-14 DIAGNOSIS — E785 Hyperlipidemia, unspecified: Secondary | ICD-10-CM

## 2021-02-14 DIAGNOSIS — R002 Palpitations: Secondary | ICD-10-CM

## 2021-02-14 DIAGNOSIS — I10 Essential (primary) hypertension: Secondary | ICD-10-CM

## 2021-02-14 NOTE — Progress Notes (Signed)
Labs sent in for collection from [facility] per patient's request.   Adelita Hone, DO

## 2021-02-18 ENCOUNTER — Ambulatory Visit: Payer: BC Managed Care – PPO | Admitting: Family Medicine

## 2021-02-18 ENCOUNTER — Telehealth: Payer: Self-pay | Admitting: *Deleted

## 2021-02-18 NOTE — Telephone Encounter (Signed)
   Telephone encounter was:  Unsuccessful.  02/18/2021 Name: Erica Campbell MRN: [id number] DOB: 10-10-1955  Unsuccessful outbound call made today to assist with:  Caregiver Stress  Outreach Attempt:  2nd Attempt  busy  [place], Care Management  [phone] [address] , [place] [address] Email : [email or web address]

## 2021-02-19 ENCOUNTER — Telehealth: Payer: Self-pay | Admitting: *Deleted

## 2021-02-19 ENCOUNTER — Encounter: Payer: BC Managed Care – PPO | Admitting: Physical Therapy

## 2021-02-19 NOTE — Telephone Encounter (Signed)
   Telephone encounter was:  Unsuccessful.  02/19/2021 Name: Erica Campbell MRN: [id number] DOB: 1956/07/27  Unsuccessful outbound call made today to assist with:  Caregiver Stress  Outreach Attempt:  3rd Attempt.  Referral closed unable to contact patient.  A HIPAA compliant voice message was left requesting a return call.  Instructed patient to call back atalso included would mail patient caregivr resources   [place], Care Management  [phone] [address] , [place] [address] Email : [email or web address]

## 2021-02-20 LAB — LIPID PANEL
Chol/HDL Ratio: 2 ratio (ref 0.0–4.4)
Cholesterol, Total: 139 mg/dL (ref 100–199)
HDL: 69 mg/dL (ref >39)
LDL Chol Calc (NIH): 49 mg/dL (ref 0–99)
Triglycerides: 119 mg/dL (ref 0–149)
VLDL Cholesterol Cal: 21 mg/dL (ref 5–40)

## 2021-02-20 LAB — CBC
Hematocrit: 41.5 % (ref 34.0–46.6)
Hemoglobin: 13.7 g/dL (ref 11.1–15.9)
MCH: 27 pg (ref 26.6–33.0)
MCHC: 33 g/dL (ref 31.5–35.7)
MCV: 82 fL (ref 79–97)
Platelets: 210 10*3/uL (ref 150–450)
RBC: 5.07 x10E6/uL (ref 3.77–5.28)
RDW: 14.8 % (ref 11.7–15.4)
WBC: 8 10*3/uL (ref 3.4–10.8)

## 2021-02-20 LAB — BASIC METABOLIC PANEL
BUN/Creatinine Ratio: 16 (ref 12–28)
BUN: 14 mg/dL (ref 8–27)
CO2: 23 mmol/L (ref 20–29)
Calcium: 10.1 mg/dL (ref 8.7–10.3)
Chloride: 101 mmol/L (ref 96–106)
Creatinine, Ser: 0.88 mg/dL (ref 0.57–1.00)
Glucose: 169 mg/dL — ABNORMAL HIGH (ref 65–99)
Potassium: 3.6 mmol/L (ref 3.5–5.2)
Sodium: 145 mmol/L — ABNORMAL HIGH (ref 134–144)
eGFR: 73 mL/min/{1.73_m2} (ref >59)

## 2021-02-20 LAB — TSH: TSH: 0.681 u[IU]/mL (ref 0.450–4.500)

## 2021-02-21 ENCOUNTER — Other Ambulatory Visit: Payer: Self-pay | Admitting: Family Medicine

## 2021-02-21 DIAGNOSIS — F419 Anxiety disorder, unspecified: Secondary | ICD-10-CM

## 2021-02-25 ENCOUNTER — Ambulatory Visit: Payer: BC Managed Care – PPO | Admitting: Family Medicine

## 2021-02-27 ENCOUNTER — Other Ambulatory Visit: Payer: Self-pay | Admitting: Family Medicine

## 2021-02-27 DIAGNOSIS — F419 Anxiety disorder, unspecified: Secondary | ICD-10-CM

## 2021-03-14 ENCOUNTER — Ambulatory Visit (INDEPENDENT_AMBULATORY_CARE_PROVIDER_SITE_OTHER): Payer: BC Managed Care – PPO | Admitting: Family Medicine

## 2021-03-14 ENCOUNTER — Encounter: Payer: Self-pay | Admitting: Family Medicine

## 2021-03-14 ENCOUNTER — Other Ambulatory Visit: Payer: Self-pay

## 2021-03-14 VITALS — BP 138/81 | HR 74 | Ht 63.0 in | Wt 180.8 lb

## 2021-03-14 DIAGNOSIS — J302 Other seasonal allergic rhinitis: Secondary | ICD-10-CM | POA: Diagnosis not present

## 2021-03-14 DIAGNOSIS — F419 Anxiety disorder, unspecified: Secondary | ICD-10-CM | POA: Diagnosis not present

## 2021-03-14 DIAGNOSIS — J45909 Unspecified asthma, uncomplicated: Secondary | ICD-10-CM

## 2021-03-14 DIAGNOSIS — R635 Abnormal weight gain: Secondary | ICD-10-CM

## 2021-03-14 DIAGNOSIS — I10 Essential (primary) hypertension: Secondary | ICD-10-CM | POA: Diagnosis not present

## 2021-03-14 MED ORDER — GABAPENTIN 100 MG PO CAPS: 100.0000 mg | ORAL_CAPSULE | Freq: Three times a day (TID) | ORAL | 3 refills | Status: DC

## 2021-03-14 MED ORDER — MECLIZINE HCL 12.5 MG PO TABS: 25.0000 mg | ORAL_TABLET | Freq: Three times a day (TID) | ORAL | 0 refills | Status: DC | PRN

## 2021-03-14 MED ORDER — ALBUTEROL SULFATE HFA 108 (90 BASE) MCG/ACT IN AERS: INHALATION_SPRAY | RESPIRATORY_TRACT | 1 refills | Status: DC

## 2021-03-14 MED ORDER — DOCUSATE SODIUM 100 MG PO CAPS: 100.0000 mg | ORAL_CAPSULE | Freq: Two times a day (BID) | ORAL | 3 refills | Status: DC

## 2021-03-14 MED ORDER — AMLODIPINE BESYLATE 5 MG PO TABS: 5.0000 mg | ORAL_TABLET | Freq: Every day | ORAL | 3 refills | Status: DC

## 2021-03-14 NOTE — Progress Notes (Signed)
    SUBJECTIVE:   CHIEF COMPLAINT / HPI:   Anxiety Patient is doing much better since starting the gabapentin.  She notes that she is now able to be a little bit more levelheaded and is doing better with conversations with her mother.  Weight gain Patient notes that she has gained weight she is currently 180 pounds.  This year she had been losing weight and was in the 160s with all of the stress and having, but is now noticing that she is having weight gain around her body.  She does not feel that she needs a referral to nutrition at this time as she has previously spoken with them before and feels like she has all the tools.  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  HTN BP doing well, not checking at home as she was previously checking her blood pressures were all within goal.  PERTINENT  PMH / PSH: Reviewed  OBJECTIVE:   BP 138/81   Pulse 74   Ht '5\' 3"'$  (1.6 m)   Wt 180 lb 12.8 oz (82 kg)   SpO2 98%   BMI 32.03 kg/m   General: NAD, well-appearing, well-nourished Respiratory: No respiratory distress, breathing comfortably, able to speak in full sentences Skin: warm and dry, no rashes noted on exposed skin Psych: Appropriate affect and mood  ASSESSMENT/PLAN:   Anxiety Anxiety is much improved on the gabapentin, patient is typically taking about 100 mg twice daily.  Notes that she would like to stay on this for a bit longer. - Refill of gabapentin provided  Essential hypertension BP in office today 138/81, patient is overall well controlled on HCTZ 25 mg, amlodipine 5 mg, losartan 100 mg, atenolol 50 mg daily. - Continue current regimen - Refill for amlodipine provided  Weight gain Patient reports that she has had a week and is currently 180 pounds, previously this year she was in the 160s at home.  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  Discussed importance of doing a food journal to document timing and pattern of food that is being eaten.  Patient has previously seen a nutritionist, does not believe she needs 1 at this time be - Recommend completing a food journal - Follow-up in next 2 to 3 months to monitor progress - Can have a referral to nutritionist at any time if patient requests   Refills for the following medications were provided.  Minimally discussed the conditions associated with these medications, as they are chronic and stable. - Albuterol inhaler - Colace - Meclizine  Erica Campbell, [place]

## 2021-03-14 NOTE — Patient Instructions (Signed)
Everything seems to be doing really well, I feel like you are handling your anxiety a lot better.  I have sent in refills for the gabapentin.  If you have any issues or want to discuss changing medications just please let me know.  Your blood pressure is looking a lot better, I sent in a refill for your amlodipine.  For your weight and nutrition I do recommend starting a type of journal, less about calorie counting and more just about how much and what you are eating during the day.  I do feel like a lot of this could be related to the change in your lifestyle from the time where you were walking around at work and now or at home more so or you are traveling  A lot of times with retirement and transitions to caregivers we do also increase our food consumption due to things like boredom and waiting around.  If you feel like you not getting the results that you want on her own we can always consider another nutrition referral or can consider looking and seeing if there are any medications that might be able to increase your weight loss and treat your other conditions.

## 2021-03-14 NOTE — Assessment & Plan Note (Signed)
Anxiety is much improved on the gabapentin, patient is typically taking about 100 mg twice daily.  Notes that she would like to stay on this for a bit longer. - Refill of gabapentin provided

## 2021-03-14 NOTE — Assessment & Plan Note (Signed)
BP in office today 138/81, patient is overall well controlled on HCTZ 25 mg, amlodipine 5 mg, losartan 100 mg, atenolol 50 mg daily. - Continue current regimen - Refill for amlodipine provided

## 2021-03-14 NOTE — Assessment & Plan Note (Signed)
Patient reports that she has had a week and is currently 180 pounds, previously this year she was in the 160s at home.  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  Discussed importance of doing a food journal to document timing and pattern of food that is being eaten.  Patient has previously seen a nutritionist, does not believe she needs 1 at this time be - Recommend completing a food journal - Follow-up in next 2 to 3 months to monitor progress - Can have a referral to nutritionist at any time if patient requests

## 2021-05-07 ENCOUNTER — Telehealth: Payer: Self-pay

## 2021-05-25 ENCOUNTER — Other Ambulatory Visit: Payer: Self-pay | Admitting: Family Medicine

## 2021-05-25 DIAGNOSIS — J309 Allergic rhinitis, unspecified: Secondary | ICD-10-CM

## 2021-05-29 ENCOUNTER — Other Ambulatory Visit: Payer: Self-pay

## 2021-05-29 ENCOUNTER — Ambulatory Visit (INDEPENDENT_AMBULATORY_CARE_PROVIDER_SITE_OTHER): Payer: Medicare Other

## 2021-05-29 DIAGNOSIS — Z23 Encounter for immunization: Secondary | ICD-10-CM

## 2021-06-02 ENCOUNTER — Encounter: Payer: Self-pay | Admitting: Physical Therapy

## 2021-07-01 ENCOUNTER — Other Ambulatory Visit: Payer: Self-pay | Admitting: Family Medicine

## 2021-07-01 ENCOUNTER — Encounter: Payer: Self-pay | Admitting: Family Medicine

## 2021-07-01 MED ORDER — AMLODIPINE BESYLATE 5 MG PO TABS: 5.0000 mg | ORAL_TABLET | Freq: Every day | ORAL | 3 refills | Status: DC

## 2021-07-04 NOTE — Telephone Encounter (Signed)
error 

## 2021-07-14 ENCOUNTER — Ambulatory Visit (INDEPENDENT_AMBULATORY_CARE_PROVIDER_SITE_OTHER): Payer: Medicare Other | Admitting: *Deleted

## 2021-07-14 DIAGNOSIS — Z23 Encounter for immunization: Secondary | ICD-10-CM

## 2021-08-08 ENCOUNTER — Ambulatory Visit (INDEPENDENT_AMBULATORY_CARE_PROVIDER_SITE_OTHER): Payer: Medicare Other | Admitting: Family Medicine

## 2021-08-08 ENCOUNTER — Ambulatory Visit (INDEPENDENT_AMBULATORY_CARE_PROVIDER_SITE_OTHER): Payer: Medicare Other

## 2021-08-08 ENCOUNTER — Encounter: Payer: Self-pay | Admitting: Family Medicine

## 2021-08-08 ENCOUNTER — Other Ambulatory Visit: Payer: Self-pay

## 2021-08-08 VITALS — BP 139/71 | HR 84 | Wt 188.0 lb

## 2021-08-08 DIAGNOSIS — Z0000 Encounter for general adult medical examination without abnormal findings: Secondary | ICD-10-CM

## 2021-08-08 DIAGNOSIS — Z1211 Encounter for screening for malignant neoplasm of colon: Secondary | ICD-10-CM | POA: Diagnosis not present

## 2021-08-08 DIAGNOSIS — E2839 Other primary ovarian failure: Secondary | ICD-10-CM | POA: Diagnosis not present

## 2021-08-08 DIAGNOSIS — F419 Anxiety disorder, unspecified: Secondary | ICD-10-CM | POA: Diagnosis not present

## 2021-08-08 DIAGNOSIS — E119 Type 2 diabetes mellitus without complications: Secondary | ICD-10-CM

## 2021-08-08 DIAGNOSIS — Z23 Encounter for immunization: Secondary | ICD-10-CM | POA: Diagnosis not present

## 2021-08-08 LAB — POCT GLYCOSYLATED HEMOGLOBIN (HGB A1C): HbA1c, POC (controlled diabetic range): 7.2 % — AB (ref 0.0–7.0)

## 2021-08-08 NOTE — Assessment & Plan Note (Signed)
Improved on gabapentin and with using chamomile tea.  - Continue gabapentin as prescribed

## 2021-08-08 NOTE — Assessment & Plan Note (Signed)
A1c today 7.2, mildly above goal of <7. No changes being made at this time. On Metformin 500mg  BID, Jardiance 10mg  daily,  Lovastatin 20mg  daily.  - Continue current management

## 2021-08-08 NOTE — Progress Notes (Signed)
° ° °  SUBJECTIVE:   Chief compliant/HPI: annual examination  Erica Campbell is a 66 y.o. who presents today for an annual exam. She notes that her anxiety has significantly improved with the gabapentin and drinking chamomile tea.  Review of systems form notable for improving anxiety, improving stool consistency.   Updated history tabs and problem list.   OBJECTIVE:   BP 139/71    Pulse 84    Wt 188 lb (85.3 kg)    BMI 33.30 kg/m   Gen: well-appearing, NAD CV: RRR, no m/r/g appreciated, no peripheral edema Pulm: CTAB, no wheezes/crackles GI: soft, non-tender, non-distended  ASSESSMENT/PLAN:   Anxiety Improved on gabapentin and with using chamomile tea.  - Continue gabapentin as prescribed  Controlled type 2 diabetes mellitus without complication (HCC) J1B today 7.2, mildly above goal of <7. No changes being made at this time. On Metformin 500mg  BID, Jardiance 10mg  daily,  Lovastatin 20mg  daily.  - Continue current management     Annual Examination  See AVS for age appropriate recommendations.   PHQ score 1, reviewed and discussed. Blood pressure reviewed and is 139/71 in the office today.  Asked about intimate partner violence and patient reports none.  Advanced directives not on file   Considered the following items based upon USPSTF recommendations: HIV testing:  previously done Hepatitis C: discussed Hepatitis B: discussed Syphilis if at high risk:  not at high risk GC/CT not at high risk and not ordered. Lipid panel (nonfasting or fasting) discussed based upon AHA recommendations and not ordered.  Consider repeat every 4-6 years.  Reviewed risk factors for latent tuberculosis and not indicated  Information for mammogram was given to patient. DEXA scan ordered. Cologuard testing ordered as patient prefers this to a colonoscopy at this time.  Referral to ophthalmology placed for diabetic eye exam. Cervical cancer screening: prior Pap reviewed and  normal Immunizations COVID booster given today. Patient instructed on getting shingrix at pharmacy if desired   Follow up in 3 months for Diabetic Check-up    Erica Campbell, [place]

## 2021-08-08 NOTE — Patient Instructions (Addendum)
It was so great seeing you today!  Today you were seen for a physical/checkup, below are listed the items that we recommend for your healthcare maintenance.  I have sent in for your Cologuard cancer screening and that will be sent to your house.  I have also given you the paperwork for getting your mammogram and you have gotten your COVID booster shots today.  If you are interested in getting your Shingrix, you can get that at your pharmacy.  I have put in a referral for ophthalmology in case you need that.  I have also placed a order for a DEXA scan (this measures if you are at risk for osteoporosis).  Health Maintenance Due  Topic Date Due   Zoster Vaccines- Shingrix (1 of 2) Never done   OPHTHALMOLOGY EXAM  07/23/2013   COLONOSCOPY (Pts 45-74yrs Insurance coverage will need to be confirmed)  01/22/2014   FOOT EXAM  12/15/2016   DEXA SCAN  Never done   COVID-19 Vaccine (4 - Booster for [place] series) 08/30/2020   MAMMOGRAM  07/16/2021    Please make sure to bring any medications you take to your appointments. If you have any questions or concerns please call the office at [phone].     Zoster Vaccine, Recombinant injection What is this medication? ZOSTER VACCINE (ZOS ter vak SEEN) is a vaccine used to reduce the risk of getting shingles. This vaccine is not used to treat shingles or nerve pain from shingles. This medicine may be used for other purposes; ask your health care provider or pharmacist if you have questions. COMMON BRAND NAME(S): [facility] What should I tell my care team before I take this medication? They need to know if you have any of these conditions: cancer immune system problems an unusual or allergic reaction to Zoster vaccine, other medications, foods, dyes, or preservatives pregnant or trying to get pregnant breast-feeding How should I use this medication? This vaccine is injected into a muscle. It is given by a health care provider. A copy of Vaccine  Information Statements will be given before each vaccination. Be sure to read this information carefully each time. This sheet may change often. Talk to your health care provider about the use of this vaccine in children. This vaccine is not approved for use in children. Overdosage: If you think you have taken too much of this medicine contact a poison control center or emergency room at once. NOTE: This medicine is only for you. Do not share this medicine with others. What if I miss a dose? Keep appointments for follow-up (booster) doses. It is important not to miss your dose. Call your health care provider if you are unable to keep an appointment. What may interact with this medication? medicines that suppress your immune system medicines to treat cancer steroid medicines like prednisone or cortisone This list may not describe all possible interactions. Give your health care provider a list of all the medicines, herbs, non-prescription drugs, or dietary supplements you use. Also tell them if you smoke, drink alcohol, or use illegal drugs. Some items may interact with your medicine. What should I watch for while using this medication? Visit your health care provider regularly. This vaccine, like all vaccines, may not fully protect everyone. What side effects may I notice from receiving this medication? Side effects that you should report to your doctor or health care professional as soon as possible: allergic reactions (skin rash, itching or hives; swelling of the face, lips, or tongue) trouble breathing  Side effects that usually do not require medical attention (report these to your doctor or health care professional if they continue or are bothersome): chills headache fever nausea pain, redness, or irritation at site where injected tiredness vomiting This list may not describe all possible side effects. Call your doctor for medical advice about side effects. You may report side effects  to FDA at 1-800-FDA-1088. Where should I keep my medication? This vaccine is only given by a health care provider. It will not be stored at home. NOTE: This sheet is a summary. It may not cover all possible information. If you have questions about this medicine, talk to your doctor, pharmacist, or health care provider.  2022 Elsevier/Gold Standard (2021-04-08 00:00:00)

## 2021-08-19 ENCOUNTER — Other Ambulatory Visit: Payer: Self-pay | Admitting: Family Medicine

## 2021-08-19 DIAGNOSIS — F419 Anxiety disorder, unspecified: Secondary | ICD-10-CM

## 2021-08-20 ENCOUNTER — Encounter: Payer: Self-pay | Admitting: Family Medicine

## 2021-08-20 DIAGNOSIS — J309 Allergic rhinitis, unspecified: Secondary | ICD-10-CM

## 2021-08-21 MED ORDER — FLUTICASONE PROPIONATE 50 MCG/ACT NA SUSP: 2.0000 | Freq: Every day | NASAL | 3 refills | Status: DC

## 2021-09-01 ENCOUNTER — Encounter: Payer: Self-pay | Admitting: Family Medicine

## 2021-09-02 ENCOUNTER — Other Ambulatory Visit: Payer: Self-pay | Admitting: Family Medicine

## 2021-09-02 DIAGNOSIS — Z1211 Encounter for screening for malignant neoplasm of colon: Secondary | ICD-10-CM

## 2021-10-02 ENCOUNTER — Other Ambulatory Visit: Payer: Self-pay | Admitting: Family Medicine

## 2021-10-06 ENCOUNTER — Encounter: Payer: Self-pay | Admitting: Family Medicine

## 2021-10-06 DIAGNOSIS — J302 Other seasonal allergic rhinitis: Secondary | ICD-10-CM

## 2021-10-06 MED ORDER — METFORMIN HCL 500 MG PO TABS: 500.0000 mg | ORAL_TABLET | Freq: Two times a day (BID) | ORAL | 3 refills | Status: DC

## 2021-10-06 MED ORDER — ALBUTEROL SULFATE HFA 108 (90 BASE) MCG/ACT IN AERS: INHALATION_SPRAY | RESPIRATORY_TRACT | 1 refills | Status: DC

## 2021-10-15 ENCOUNTER — Ambulatory Visit (INDEPENDENT_AMBULATORY_CARE_PROVIDER_SITE_OTHER): Payer: Medicare Other | Admitting: Family Medicine

## 2021-10-15 ENCOUNTER — Encounter: Payer: Self-pay | Admitting: Family Medicine

## 2021-10-15 ENCOUNTER — Other Ambulatory Visit: Payer: Self-pay

## 2021-10-15 DIAGNOSIS — I10 Essential (primary) hypertension: Secondary | ICD-10-CM

## 2021-10-15 MED ORDER — ATENOLOL 50 MG PO TABS: 50.0000 mg | ORAL_TABLET | Freq: Two times a day (BID) | ORAL | 1 refills | Status: DC

## 2021-10-15 NOTE — Patient Instructions (Addendum)
I am glad you have a good blood pressure reading today.   ?Please take your blood pressure multiple times at home with the arm cuff, not the wrist cuff.  The goal is to have your average blood pressure reading less than 140/90.   ?You can get a lttle worried and call us for very high readings.  Top number 200 or above.  Bottom number 115 or above. ?It may not matter when you take the blood pressure medications.   ?I would always take the hydrochlorothiazide in the morning because it is a diuretic.  ?The losartan and amlodipine might be best at night.   ?Increase the atenolol to one pill twice a day.   ?Follow up in 1-3 months when  ?

## 2021-10-15 NOTE — Progress Notes (Signed)
? ? ?  SUBJECTIVE:  ? ?CHIEF COMPLAINT / HPI:  ? ?FU hypertension: Known HBP on multiple meds.  Compliant.  Went to dentist yesterday.  BP and pulse up.  Went home and immediately rechecked.  Still high.  Also experienced the sensation of transient palpitations.  Palpitations do not happen frequently.  She believes this scenario has happened once before and attributed it to a "panic attack"  (Her words, not an official diagnosis.  Today is relaxed.  No chest pain or palpitations.  She worries about heart attack and stroke which run in the family.  Her high home readings were on a device that measures BP at the wrist.  She also has an arm cuff. ? ? ? ?OBJECTIVE:  ? ?BP 136/68   Pulse 92   Wt 182 lb 6.4 oz (82.7 kg)   SpO2 96%   BMI 32.31 kg/m?   ?Good BP and pulse noted ?Lungs clear ?Cardiac RRR without m or g ? ?ASSESSMENT/PLAN:  ? ?Essential hypertension ?Control uncertain.  Clearly elevated yesterday.  I do not trust wrist readings as much as arm cuffs.   ?Noted to be taking atenolol once daily.  Lasted evidence is that it may not last 24 hours.  Increase atenolol to twice daily.  Home BP readings.  Return if elevated.  Parameters given.  See aVS ?  ? ? ?Zenia Resides, MD ?[place]  ?

## 2021-10-15 NOTE — Telephone Encounter (Signed)
Called patient. Patient reports concerns with elevated BP since yesterday at dentist's office.  ? ?Patient continues to be asymptomatic, other than fatigue.  ? ?Scheduled patient same day appointment this afternoon with Dr. Andria Frames.  ? ?Provided with ED/UC precautions in the meantime.  ? ?Talbot Grumbling, RN ? ?

## 2021-10-15 NOTE — Assessment & Plan Note (Signed)
Control uncertain.  Clearly elevated yesterday.  I do not trust wrist readings as much as arm cuffs.   ?Noted to be taking atenolol once daily.  Lasted evidence is that it may not last 24 hours.  Increase atenolol to twice daily.  Home BP readings.  Return if elevated.  Parameters given.  See aVS ?

## 2021-10-22 ENCOUNTER — Other Ambulatory Visit: Payer: Self-pay | Admitting: Family Medicine

## 2021-11-27 ENCOUNTER — Encounter: Payer: Self-pay | Admitting: Family Medicine

## 2021-11-28 MED ORDER — LOSARTAN POTASSIUM 100 MG PO TABS: 100.0000 mg | ORAL_TABLET | Freq: Every day | ORAL | 3 refills | Status: DC

## 2021-12-22 ENCOUNTER — Other Ambulatory Visit: Payer: Self-pay | Admitting: Family Medicine

## 2021-12-22 DIAGNOSIS — I10 Essential (primary) hypertension: Secondary | ICD-10-CM

## 2022-01-01 ENCOUNTER — Other Ambulatory Visit: Payer: Self-pay | Admitting: Family Medicine

## 2022-01-01 DIAGNOSIS — J302 Other seasonal allergic rhinitis: Secondary | ICD-10-CM

## 2022-01-06 ENCOUNTER — Encounter: Payer: Self-pay | Admitting: *Deleted

## 2022-01-14 ENCOUNTER — Ambulatory Visit
Admission: RE | Admit: 2022-01-14 | Discharge: 2022-01-14 | Disposition: A | Discharge status: Home / Self Care | Payer: 59 | Source: Ambulatory Visit | Attending: Family Medicine | Admitting: Family Medicine

## 2022-01-14 ENCOUNTER — Encounter: Payer: Self-pay | Admitting: Family Medicine

## 2022-01-14 DIAGNOSIS — E2839 Other primary ovarian failure: Secondary | ICD-10-CM

## 2022-01-15 ENCOUNTER — Other Ambulatory Visit: Payer: Self-pay | Admitting: Family Medicine

## 2022-01-15 MED ORDER — OYSTER SHELL CALCIUM/D3 500-5 MG-MCG PO TABS: 1.0000 | ORAL_TABLET | Freq: Every day | ORAL | 1 refills | Status: DC

## 2022-01-15 MED ORDER — DICYCLOMINE HCL 10 MG PO CAPS
10.0000 mg | ORAL_CAPSULE | Freq: Three times a day (TID) | ORAL | 0 refills | Status: DC | PRN
Start: 2022-01-15 — End: 2022-09-22

## 2022-01-15 NOTE — Telephone Encounter (Signed)
Called patient to discuss results of DEXA scan. T score is -1.0, which meets osteopenia diagnosis criteria.   Discussed increasing weight bearing exercises for bone strength. Consider [employer] program if able to afford. Also will send in for calcium/vitamin D supplement.   Maie Kesinger, DO

## 2022-02-02 ENCOUNTER — Other Ambulatory Visit: Payer: Self-pay | Admitting: Family Medicine

## 2022-02-02 DIAGNOSIS — F419 Anxiety disorder, unspecified: Secondary | ICD-10-CM

## 2022-02-05 ENCOUNTER — Encounter: Payer: Self-pay | Admitting: Family Medicine

## 2022-02-05 LAB — HM DIABETES EYE EXAM

## 2022-03-04 ENCOUNTER — Encounter (HOSPITAL_COMMUNITY): Payer: Self-pay

## 2022-03-04 ENCOUNTER — Other Ambulatory Visit: Payer: Self-pay

## 2022-03-04 ENCOUNTER — Emergency Department (HOSPITAL_COMMUNITY)
Admission: EM | Admit: 2022-03-04 | Discharge: 2022-03-05 | Discharge status: Against Medical Advice | Payer: Medicare Other | Attending: Emergency Medicine | Admitting: Emergency Medicine

## 2022-03-04 ENCOUNTER — Telehealth: Payer: Self-pay | Admitting: Family Medicine

## 2022-03-04 DIAGNOSIS — Z7901 Long term (current) use of anticoagulants: Secondary | ICD-10-CM | POA: Diagnosis not present

## 2022-03-04 DIAGNOSIS — Z5321 Procedure and treatment not carried out due to patient leaving prior to being seen by health care provider: Secondary | ICD-10-CM | POA: Diagnosis not present

## 2022-03-04 DIAGNOSIS — R42 Dizziness and giddiness: Secondary | ICD-10-CM | POA: Diagnosis present

## 2022-03-04 NOTE — ED Provider Triage Note (Signed)
  Emergency Medicine Provider Triage Evaluation Note  MRN:  [id number]  Arrival date & time: 03/04/22    Medically screening exam initiated at 10:17 PM.   CC:   Ingestion   HPI:  Erica Campbell is a 66 y.o. year-old female presents to the ED with chief complaint of accidental ingestion.  Took her husband's amlodipine, carvedilol, eliquis, and melatonin.  Patient takes Atenolol and Amlodipine in morning.  She states that she feels a little drowsy, but not lightheaded.  History provided by patient. ROS:  -As included in HPI PE:   Vitals:   03/04/22 2208  BP: (!) 151/93  Pulse: 91  Resp: 12  Temp: 98.7 F (37.1 C)  SpO2: 94%    Non-toxic appearing No respiratory distress  MDM:    Patient was informed that the remainder of the evaluation will be completed by another provider, this initial triage assessment does not replace that evaluation, and the importance of remaining in the ED until their evaluation is complete.    Montine Circle, PA-C 03/04/22 2222

## 2022-03-04 NOTE — ED Triage Notes (Signed)
Accidentally took husbands medications at 815PM.    '5mg'$  Eliquis  '5mg'$  amlodipine '5mg'$  melatonin '25mg'$  carvedilol   Reports dry mouth and some sleepiness.

## 2022-03-04 NOTE — Telephone Encounter (Signed)
After-hours/emergency line call  Patient calls after-hours line reporting that she accidentally took 4 of her husband's medications just prior to calling.  Medications are:  Carvedilol 25 mg Apixaban 5 mg Amlodipine 5 mg Melatonin  No symptoms currently but is feeling very anxious.  I have asked her to take her blood pressure.  Current blood pressure is 178/95, pulse 114.  She has a history of HTN and also takes multiple antihypertensive medications including amlodipine, atenolol, HCTZ, losartan listed in her chart.  I advised patient to proceed to the ED.  I think she may need at least a few hours of observation in the ED.  Patient agreeable to proceed to the ED.  I have notified ED charge nurse per patient request.

## 2022-03-05 ENCOUNTER — Encounter: Payer: Self-pay | Admitting: Family Medicine

## 2022-03-05 NOTE — Telephone Encounter (Signed)
Patient calls nurse line regarding concerns with how she should proceed with BP medications.   She reports that she has not taken any medications since she had accidentally taken her husband's medications yesterday.   Denies chest pain, blurry vision or shortness of breath.   Most recent BP was 146/93.   Patient would like to know how she should proceed with taking her blood pressure medications.  Please advise.   Talbot Grumbling, RN

## 2022-03-05 NOTE — ED Notes (Signed)
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

## 2022-03-20 ENCOUNTER — Encounter: Payer: Self-pay | Admitting: Family Medicine

## 2022-03-20 DIAGNOSIS — F419 Anxiety disorder, unspecified: Secondary | ICD-10-CM

## 2022-03-20 MED ORDER — GABAPENTIN 100 MG PO CAPS: ORAL_CAPSULE | ORAL | 3 refills | Status: DC

## 2022-03-20 MED ORDER — LOVASTATIN 20 MG PO TABS: ORAL_TABLET | ORAL | 3 refills | Status: DC

## 2022-04-03 ENCOUNTER — Other Ambulatory Visit: Payer: Self-pay | Admitting: Family Medicine

## 2022-05-01 ENCOUNTER — Other Ambulatory Visit: Payer: Self-pay | Admitting: Family Medicine

## 2022-05-01 DIAGNOSIS — I10 Essential (primary) hypertension: Secondary | ICD-10-CM

## 2022-05-07 ENCOUNTER — Encounter: Payer: Self-pay | Admitting: Family Medicine

## 2022-05-11 ENCOUNTER — Encounter: Payer: Self-pay | Admitting: Family Medicine

## 2022-05-11 ENCOUNTER — Ambulatory Visit (INDEPENDENT_AMBULATORY_CARE_PROVIDER_SITE_OTHER): Payer: Medicare Other | Admitting: Family Medicine

## 2022-05-11 VITALS — BP 139/81 | HR 86 | Ht 63.0 in | Wt 184.5 lb

## 2022-05-11 DIAGNOSIS — E119 Type 2 diabetes mellitus without complications: Secondary | ICD-10-CM | POA: Diagnosis present

## 2022-05-11 DIAGNOSIS — Z23 Encounter for immunization: Secondary | ICD-10-CM | POA: Diagnosis not present

## 2022-05-11 DIAGNOSIS — E785 Hyperlipidemia, unspecified: Secondary | ICD-10-CM | POA: Diagnosis not present

## 2022-05-11 DIAGNOSIS — R42 Dizziness and giddiness: Secondary | ICD-10-CM | POA: Diagnosis not present

## 2022-05-11 DIAGNOSIS — I10 Essential (primary) hypertension: Secondary | ICD-10-CM | POA: Diagnosis not present

## 2022-05-11 LAB — POCT GLYCOSYLATED HEMOGLOBIN (HGB A1C): HbA1c, POC (controlled diabetic range): 7.4 % — AB (ref 0.0–7.0)

## 2022-05-11 MED ORDER — EMPAGLIFLOZIN 25 MG PO TABS: 25.0000 mg | ORAL_TABLET | Freq: Every day | ORAL | 3 refills | Status: DC

## 2022-05-11 NOTE — Patient Instructions (Addendum)
Vertigo: I have put in a referral for ENT to see if we can figure out what is causing the vertigo. We can always do physical therapy as there are therapists who are qualified to work with vertigo patients.   Diabetes: Your A1c today was 7.4, I have sent in an increased dose of your Jardiance, until you get the new prescription you can take 2 of your current dose.   Neck sensation: as long as it is not causing you pain and is not affecting your ability to do things, I think we are okay to just monitor. If it does get worse let me know and we may need to consider neck imaging.   Gabapentin: if you decide to increase how much you are taking, just let me know so that I can try to send in enough pills for you to adjust your dose as you need.

## 2022-05-11 NOTE — Progress Notes (Signed)
    SUBJECTIVE:   CHIEF COMPLAINT / HPI:   Odd sensation  - Present in neck - Positional in bed  - Present in her left shoulder  Anxiety - Doing well on Gabapentin '100mg'$  BID - Tried higher doses and "felt off"   Vertigo - Believes that they are related to her sinuses/pressure - No tinnitus or hearing issues - Has some balance issues due to it, though no falls  T2DM  - Wondering if she would be able to get a CGM - Knows her diet lately has not been great - Wants to work on her diet before pursuing an injectable  PERTINENT  PMH / PSH: Reviewed  OBJECTIVE:   BP 139/81   Pulse 86   Ht '5\' 3"'$  (1.6 m)   Wt 184 lb 8 oz (83.7 kg)   SpO2 99%   BMI 32.68 kg/m   Gen: well-appearing, NAD CV: RRR, no m/r/g appreciated, no peripheral edema Pulm: CTAB, no wheezes/crackles HEENT: TM clear bilaterally, no erythema in the ear canals. Throat clear. No crepitus or asymmetry with TMJ testing Neck: Full ROM without pain. Small circumscribed area of the trapezius region that has abnormal sensation with sidebending the neck and head to the left that is not reproducible just upon palpation  ASSESSMENT/PLAN:   Essential hypertension Blood pressure stable, slightly above goal at 139/81. - Amlodipine '5mg'$  daily - HCTZ '25mg'$  daily  - Atenolol '50mg'$  BID - Losartan '100mg'$  daily - BMP today  Controlled type 2 diabetes mellitus without complication (HCC) F0Y today 7.4, mildly above goal. Will increase jardiance dosing at this time.  - Increase Jardiance to '25mg'$  daily - Continue meformin '500mg'$  BID - Can consider GLP-1 - Will reach out to pharmacy team to see if CGM possible - Urine microalbumin today   Vertigo Doesn't appear to be consistent with BPPV and is causing the patient some balance issues so will refer to ENT at this time. No obvious structural issues in the ear and doesn't have neuropathy affecting balance. - ENT referral  Abnormal sensation  Physical exam unremarkable and  sensation is reproducible with side bending neck towards the left. Likely related to a nerve, given the distribution it could just be a cutaneous nerve involved. Causes no pain or other symptoms, patient agreeable to conservative management at this time. Also already on gabapentin.   Healthcare maintenance - Has cologuard and will check if she can still use the one she has - Flu vaccine given today  - Needs to call to schedule mammogram - Foot exam with next visit  Rise Patience, [place]

## 2022-05-12 LAB — LIPID PANEL
Chol/HDL Ratio: 2 ratio (ref 0.0–4.4)
Cholesterol, Total: 140 mg/dL (ref 100–199)
HDL: 69 mg/dL (ref >39)
LDL Chol Calc (NIH): 52 mg/dL (ref 0–99)
Triglycerides: 108 mg/dL (ref 0–149)
VLDL Cholesterol Cal: 19 mg/dL (ref 5–40)

## 2022-05-12 LAB — BASIC METABOLIC PANEL
BUN/Creatinine Ratio: 18 (ref 12–28)
BUN: 14 mg/dL (ref 8–27)
CO2: 25 mmol/L (ref 20–29)
Calcium: 10 mg/dL (ref 8.7–10.3)
Chloride: 103 mmol/L (ref 96–106)
Creatinine, Ser: 0.78 mg/dL (ref 0.57–1.00)
Glucose: 162 mg/dL — ABNORMAL HIGH (ref 70–99)
Potassium: 3.7 mmol/L (ref 3.5–5.2)
Sodium: 144 mmol/L (ref 134–144)
eGFR: 83 mL/min/{1.73_m2} (ref >59)

## 2022-05-12 LAB — MICROALBUMIN / CREATININE URINE RATIO
Creatinine, Urine: 60.9 mg/dL
Microalb/Creat Ratio: 14 mg/g creat (ref 0–29)
Microalbumin, Urine: 8.5 ug/mL

## 2022-05-12 NOTE — Assessment & Plan Note (Addendum)
A1c today 7.4, mildly above goal. Will increase jardiance dosing at this time.  - Increase Jardiance to '25mg'$  daily - Continue meformin '500mg'$  BID - Can consider GLP-1 - Will reach out to pharmacy team to see if CGM possible - Urine microalbumin today

## 2022-05-12 NOTE — Assessment & Plan Note (Addendum)
Blood pressure stable, slightly above goal at 139/81. - Amlodipine '5mg'$  daily - HCTZ '25mg'$  daily  - Atenolol '50mg'$  BID - Losartan '100mg'$  daily - BMP today

## 2022-05-14 ENCOUNTER — Encounter: Payer: Self-pay | Admitting: Family Medicine

## 2022-06-08 ENCOUNTER — Encounter: Payer: Self-pay | Admitting: Family Medicine

## 2022-06-10 ENCOUNTER — Other Ambulatory Visit (HOSPITAL_COMMUNITY): Payer: Self-pay

## 2022-06-15 ENCOUNTER — Telehealth: Payer: Self-pay

## 2022-06-15 NOTE — Telephone Encounter (Signed)
Left message with pt regarding jardiance assistance options.  Encouraged patient to apply for LIS/Extra Help with [employer] first before we move forward with PAP. Gave call back number to discuss in further detail [phone].

## 2022-07-06 ENCOUNTER — Encounter: Payer: Self-pay | Admitting: Family Medicine

## 2022-07-07 ENCOUNTER — Encounter: Payer: Self-pay | Admitting: Family Medicine

## 2022-07-07 ENCOUNTER — Other Ambulatory Visit: Payer: Self-pay | Admitting: Family Medicine

## 2022-07-07 DIAGNOSIS — F419 Anxiety disorder, unspecified: Secondary | ICD-10-CM

## 2022-07-07 MED ORDER — GABAPENTIN 100 MG PO CAPS: 200.0000 mg | ORAL_CAPSULE | Freq: Two times a day (BID) | ORAL | 0 refills | Status: DC

## 2022-07-09 NOTE — Telephone Encounter (Signed)
Pr returned phone call regarding jardiance copay. Gave pt info on how to apply for LIS/Extra Help with [employer]. Pt will reach back out with decision info.

## 2022-08-14 ENCOUNTER — Other Ambulatory Visit (HOSPITAL_COMMUNITY): Payer: Self-pay

## 2022-08-14 NOTE — Telephone Encounter (Signed)
Pt reached out again regarding jardiance PAP.  Informed patient she will have to apply for LIS herself either online or over the phone with social security admin. This has to be done and denied before being able to move forward with [facility] application.

## 2022-08-22 ENCOUNTER — Other Ambulatory Visit: Payer: Self-pay | Admitting: Family Medicine

## 2022-08-22 DIAGNOSIS — J309 Allergic rhinitis, unspecified: Secondary | ICD-10-CM

## 2022-09-03 ENCOUNTER — Encounter: Payer: Self-pay | Admitting: Family Medicine

## 2022-09-06 ENCOUNTER — Encounter: Payer: Self-pay | Admitting: Family Medicine

## 2022-09-18 ENCOUNTER — Encounter: Payer: Self-pay | Admitting: Family Medicine

## 2022-09-18 ENCOUNTER — Other Ambulatory Visit: Payer: Self-pay | Admitting: Family Medicine

## 2022-09-22 ENCOUNTER — Other Ambulatory Visit: Payer: Self-pay | Admitting: Family Medicine

## 2022-09-23 MED ORDER — DICYCLOMINE HCL 10 MG PO CAPS
10.0000 mg | ORAL_CAPSULE | Freq: Three times a day (TID) | ORAL | 0 refills | Status: DC | PRN
Start: 2022-09-23 — End: 2023-12-09

## 2022-09-26 ENCOUNTER — Encounter: Payer: Self-pay | Admitting: Family Medicine

## 2022-10-04 ENCOUNTER — Other Ambulatory Visit: Payer: Self-pay | Admitting: Family Medicine

## 2022-10-05 ENCOUNTER — Encounter: Payer: Self-pay | Admitting: Family Medicine

## 2022-10-20 ENCOUNTER — Telehealth: Payer: Self-pay | Admitting: Family Medicine

## 2022-10-20 NOTE — Telephone Encounter (Signed)
Contacted Erica Campbell to schedule their annual wellness visit. Appointment made for 10/27/2022.  Thank you,  [place] Direct dial  [phone]

## 2022-10-26 ENCOUNTER — Other Ambulatory Visit: Payer: Self-pay | Admitting: Family Medicine
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
## 2022-10-26 NOTE — Patient Instructions (Signed)

## 2022-10-27 ENCOUNTER — Ambulatory Visit (INDEPENDENT_AMBULATORY_CARE_PROVIDER_SITE_OTHER): Payer: Medicare Other

## 2022-10-27 DIAGNOSIS — Z1231 Encounter for screening mammogram for malignant neoplasm of breast: Secondary | ICD-10-CM

## 2022-10-27 DIAGNOSIS — Z0000 Encounter for general adult medical examination without abnormal findings: Secondary | ICD-10-CM | POA: Diagnosis not present

## 2022-10-27 DIAGNOSIS — Z1211 Encounter for screening for malignant neoplasm of colon: Secondary | ICD-10-CM

## 2022-11-05 ENCOUNTER — Encounter: Payer: Self-pay | Admitting: Family Medicine

## 2022-11-05 MED ORDER — EMPAGLIFLOZIN 25 MG PO TABS: 25.0000 mg | ORAL_TABLET | Freq: Every day | ORAL | 0 refills | Status: DC

## 2022-11-22 ENCOUNTER — Other Ambulatory Visit: Payer: Self-pay | Admitting: Family Medicine

## 2022-11-22 DIAGNOSIS — F419 Anxiety disorder, unspecified: Secondary | ICD-10-CM

## 2022-11-22 DIAGNOSIS — I10 Essential (primary) hypertension: Secondary | ICD-10-CM

## 2022-11-22 DIAGNOSIS — J302 Other seasonal allergic rhinitis: Secondary | ICD-10-CM

## 2022-11-23 MED ORDER — EMPAGLIFLOZIN 25 MG PO TABS: 25.0000 mg | ORAL_TABLET | Freq: Every day | ORAL | 3 refills | Status: DC

## 2022-11-23 NOTE — Telephone Encounter (Signed)
Helping to cover for Dr. Clayborne Artist.   Received refill request. I have a couple concerns and thoughts on medication optimization. I will have admin call patient to schedule an appointment with any resident.   Concerns:  - needs to come off atenolol, there are better medications and B blockers for her - due for BMP to check K and creatine on lisinopril and HCTZ (last October 2023) - high ASCVD risk, needs HIGH intensity statin with atorvastatin or rosuvastatin   Thoughts to simplify regimen: - if stable on HTN meds, would rx combo drug with lisinopril and HCTZ together - if stable on T2DM meds, could rx combo drug with SGLT2 and metformin together  Fayette Pho, MD   The 10-year ASCVD risk score (Arnett DK, et al., 2019) is: 35.2%   Values used to calculate the score:     Age: 42 years     Sex: Female     Is Non-Hispanic African American: Yes     Diabetic: Yes     Tobacco smoker: Yes     Systolic Blood Pressure: 139 mmHg     Is BP treated: Yes     HDL Cholesterol: 69 mg/dL     Total Cholesterol: 140 mg/dL

## 2023-01-15 ENCOUNTER — Encounter: Payer: Medicare Other | Admitting: Family Medicine

## 2023-01-22 ENCOUNTER — Other Ambulatory Visit: Payer: Self-pay | Admitting: Family Medicine

## 2023-01-22 DIAGNOSIS — I10 Essential (primary) hypertension: Secondary | ICD-10-CM

## 2023-02-11 ENCOUNTER — Ambulatory Visit (INDEPENDENT_AMBULATORY_CARE_PROVIDER_SITE_OTHER): Payer: Medicare Other | Admitting: Family Medicine

## 2023-02-11 ENCOUNTER — Other Ambulatory Visit: Payer: Self-pay

## 2023-02-11 ENCOUNTER — Encounter: Payer: Self-pay | Admitting: Family Medicine

## 2023-02-11 ENCOUNTER — Other Ambulatory Visit: Payer: Self-pay | Admitting: Family Medicine

## 2023-02-11 VITALS — BP 138/85 | HR 85 | Ht 63.0 in | Wt 182.8 lb

## 2023-02-11 DIAGNOSIS — E119 Type 2 diabetes mellitus without complications: Secondary | ICD-10-CM | POA: Diagnosis not present

## 2023-02-11 DIAGNOSIS — N951 Menopausal and female climacteric states: Secondary | ICD-10-CM | POA: Diagnosis not present

## 2023-02-11 DIAGNOSIS — E785 Hyperlipidemia, unspecified: Secondary | ICD-10-CM | POA: Diagnosis not present

## 2023-02-11 DIAGNOSIS — I10 Essential (primary) hypertension: Secondary | ICD-10-CM | POA: Diagnosis not present

## 2023-02-11 LAB — POCT GLYCOSYLATED HEMOGLOBIN (HGB A1C): HbA1c, POC (controlled diabetic range): 7.6 % — AB (ref 0.0–7.0)

## 2023-02-11 MED ORDER — ESTROGENS CONJUGATED 0.625 MG/GM VA CREA: 1.0000 | TOPICAL_CREAM | Freq: Every day | VAGINAL | 0 refills | Status: DC

## 2023-02-11 NOTE — Assessment & Plan Note (Signed)
Check lipid panel today.  Instructed patient to follow-up with PCP for further discussion.

## 2023-02-11 NOTE — Progress Notes (Signed)
    SUBJECTIVE:   CHIEF COMPLAINT / HPI:   Patient's most pressing concern is vaginal irritation.  This has been going on for several months but she finally got to a point where it was irritating her enough that she came in.  She has been reading for her sick husband and aged parents and so has been struggling to prioritize her own self-care.  She gets itching on the outside of her vagina in the evenings when she is trying to fall asleep.  Sometimes it is enough to wake her from light sleep.  Occasionally she feels the itching on her inner upper thigh and is not accompanied by burning, discharge, odor.  She is not having any urinary symptoms.  She is not currently sexually active.  No internal vaginal discomfort.  She is also due for routine screening labs for her hypertension, diabetes, hyperlipidemia.  We will draw labs today and have her follow-up with her primary care doctor discussed the results.  PERTINENT  PMH / PSH: Early menopause, breast cancer in remission.  OBJECTIVE:   BP 138/85   Pulse 85   Ht 5\' 3"  (1.6 m)   Wt 182 lb 12.8 oz (82.9 kg)   SpO2 98%   BMI 32.38 kg/m   GU: Daja present as chaperone.  External female genitalia grossly normal, external mucosal surface of labia majora and minora dry and mildly atrophied.  No signs of rash, lichen planus, or lichen sclerosis.  No internal exam performed at this time.  ASSESSMENT/PLAN:   Essential hypertension Check CMP and BP today.  Instructed patient to follow-up with her PCP at a later time.  Controlled type 2 diabetes mellitus without complication (HCC) Checked A1c today.  Instructed patient to follow-up with PCP for further discussion  Hyperlipidemia Check lipid panel today.  Instructed patient to follow-up with PCP for further discussion.  Menopausal vaginal dryness Discussed with patient options for managing dryness.  Patient opted to try using coconut oil for a few days, but did ask to have estrogen cream called in  in case coconut oil did not work.  Discussed with Dr.Jagadish her appointment in August what has worked best for her.     Gerrit Heck, DO [facility]

## 2023-02-11 NOTE — Assessment & Plan Note (Signed)
Discussed with patient options for managing dryness.  Patient opted to try using coconut oil for a few days, but did ask to have estrogen cream called in in case coconut oil did not work.  Discussed with Dr.Jagadish her appointment in August what has worked best for her.

## 2023-02-11 NOTE — Patient Instructions (Addendum)
It was wonderful to see you today.  Today we talked about:  Vaginal irritation that you have been having.  As discussed it is most likely postmenopausal dryness.  You can use food grade coconut oil as a moisturizer until your next visit with Dr. Laroy Apple.  If you experience any new discharge burning or other concerning symptoms please call and make another appointment.  Also drew some routine labs today.  As discussed you can follow-up with Dr. Willene Hatchet about those results.  You may also continue to take your atenolol until your visit with your PCP.   Thank you for choosing [facility].   Please call [phone] with any questions about today's appointment.  Please arrive at least 15 minutes prior to your scheduled appointments.   If you had blood work today, I will send you a MyChart message or a letter if results are normal. Otherwise, I will give you a call.   If you had a referral placed, they will call you to set up an appointment. Please give [place] a call if you don't hear back in the next 2 weeks.   If you need additional refills before your next appointment, please call your pharmacy first.   Gerrit Heck, DO Family Medicine

## 2023-02-11 NOTE — Assessment & Plan Note (Signed)
Checked A1c today.  Instructed patient to follow-up with PCP for further discussion

## 2023-02-11 NOTE — Assessment & Plan Note (Signed)
Check CMP and BP today.  Instructed patient to follow-up with her PCP at a later time.

## 2023-02-12 LAB — COMPREHENSIVE METABOLIC PANEL
ALT: 13 IU/L (ref 0–32)
AST: 11 IU/L (ref 0–40)
Albumin: 4.3 g/dL (ref 3.9–4.9)
Alkaline Phosphatase: 90 IU/L (ref 44–121)
BUN/Creatinine Ratio: 21 (ref 12–28)
BUN: 15 mg/dL (ref 8–27)
Bilirubin Total: 0.3 mg/dL (ref 0.0–1.2)
CO2: 23 mmol/L (ref 20–29)
Calcium: 9.8 mg/dL (ref 8.7–10.3)
Chloride: 100 mmol/L (ref 96–106)
Creatinine, Ser: 0.72 mg/dL (ref 0.57–1.00)
Globulin, Total: 3 g/dL (ref 1.5–4.5)
Glucose: 174 mg/dL — ABNORMAL HIGH (ref 70–99)
Potassium: 3.7 mmol/L (ref 3.5–5.2)
Sodium: 139 mmol/L (ref 134–144)
Total Protein: 7.3 g/dL (ref 6.0–8.5)
eGFR: 92 mL/min/{1.73_m2} (ref >59)

## 2023-02-12 LAB — LIPID PANEL
Chol/HDL Ratio: 2.1 ratio (ref 0.0–4.4)
Cholesterol, Total: 152 mg/dL (ref 100–199)
HDL: 74 mg/dL (ref >39)
LDL Chol Calc (NIH): 59 mg/dL (ref 0–99)
Triglycerides: 109 mg/dL (ref 0–149)
VLDL Cholesterol Cal: 19 mg/dL (ref 5–40)

## 2023-02-24 ENCOUNTER — Telehealth: Payer: Self-pay

## 2023-02-24 NOTE — Telephone Encounter (Signed)
Pharmacy Patient Advocate Encounter   Received notification from CoverMyMeds that prior authorization for [facility] MAINTENANCE PACK INSERTS is required/requested.  The patient is insured through  [facility]  .   PA submitted to [facility] MEDICARE via CoverMyMeds Key/confirmation #/EOC [id number] Status is pending

## 2023-02-25 NOTE — Telephone Encounter (Signed)
Pharmacy Patient Advocate Encounter  Received notification from [facility] that Prior Authorization for [employer] Maintenance Pack inserts has been DENIED. Please advise how you'd like to proceed. Full denial letter will be uploaded to the media tab. See denial reason below.    PA #/Case ID/Reference #: [id number]

## 2023-03-08 NOTE — Progress Notes (Deleted)
    SUBJECTIVE:   CHIEF COMPLAINT / HPI:   Diabetic Follow Up: Patient is a 67 y.o. female who present today for diabetic follow up.   Patient endorses {rwdmsmartlistproblems:24882}  Home medications include: jardiance 25 mg daily, metformin 500 BID ACEi/ARB: yes losartan 100 mg daily Statin: yes lovastatin 40 mg daily Patient endorses taking these medications as prescribed.***  Most recent A1Cs:  Lab Results  Component Value Date   HGBA1C 7.6 (A) 02/11/2023   HGBA1C 7.4 (A) 05/11/2022   HGBA1C 7.2 (A) 08/08/2021   Last Microalbumin, LDL, Creatinine: Lab Results  Component Value Date   LDLCALC 59 02/11/2023   CREATININE 0.72 02/11/2023    HLD On lovastatin. LDL and Tchol controlled last visit.   Hypertension: Patient is a 67 y.o. female who present today for follow up of hypertension.   Home medications include: hydrochlorothiazide 25 mg daily, losartan 100 mg daily, amlodipine 5 mg daily Patient endorses taking these medications as prescribed.*** Denies any headache, vision changes, shortness of breath, lower extremity swelling or chest pain   Most recent creatinine trend:  Lab Results  Component Value Date   CREATININE 0.72 02/11/2023   CREATININE 0.78 05/11/2022   CREATININE 0.88 02/19/2021   Patient {rwdoesdoesnot:24881} check blood pressure at home.  Patient {HAS HAS ZOX:09604} had a BMP in the past 1 year.  HM Due for colonoscopy, mammogram  PERTINENT  PMH / PSH: HTN, asthma, IBS, T2DM  OBJECTIVE:   There were no vitals taken for this visit.  ***  ASSESSMENT/PLAN:   No problem-specific Assessment & Plan notes found for this encounter.     Levin Erp, MD [facility]

## 2023-03-11 ENCOUNTER — Encounter: Payer: Self-pay | Admitting: Student

## 2023-03-11 ENCOUNTER — Ambulatory Visit: Payer: Medicare Other | Admitting: Student

## 2023-03-11 DIAGNOSIS — F419 Anxiety disorder, unspecified: Secondary | ICD-10-CM

## 2023-03-11 DIAGNOSIS — J309 Allergic rhinitis, unspecified: Secondary | ICD-10-CM

## 2023-03-11 MED ORDER — FLUTICASONE PROPIONATE 50 MCG/ACT NA SUSP: 2.0000 | Freq: Every day | NASAL | 3 refills | Status: DC

## 2023-03-11 MED ORDER — AMLODIPINE BESYLATE 5 MG PO TABS: 5.0000 mg | ORAL_TABLET | Freq: Every day | ORAL | 0 refills | Status: DC

## 2023-03-11 MED ORDER — GABAPENTIN 100 MG PO CAPS: 200.0000 mg | ORAL_CAPSULE | Freq: Two times a day (BID) | ORAL | 0 refills | Status: DC

## 2023-03-11 MED ORDER — OYSTER SHELL CALCIUM/D3 500-5 MG-MCG PO TABS
1.0000 | ORAL_TABLET | Freq: Every day | ORAL | 1 refills | Status: DC
Start: 2023-03-11 — End: 2023-09-02

## 2023-03-22 ENCOUNTER — Other Ambulatory Visit: Payer: Self-pay | Admitting: Family Medicine

## 2023-03-28 ENCOUNTER — Other Ambulatory Visit: Payer: Self-pay | Admitting: Student

## 2023-03-28 DIAGNOSIS — N951 Menopausal and female climacteric states: Secondary | ICD-10-CM

## 2023-04-02 NOTE — Telephone Encounter (Signed)
Rec'd 2nd PA request for medication. Alternative options: Estradiol and Yuvafem.

## 2023-04-06 ENCOUNTER — Other Ambulatory Visit: Payer: Self-pay | Admitting: Student

## 2023-04-06 ENCOUNTER — Encounter: Payer: Self-pay | Admitting: Student

## 2023-04-06 DIAGNOSIS — F419 Anxiety disorder, unspecified: Secondary | ICD-10-CM

## 2023-04-06 MED ORDER — LOVASTATIN 20 MG PO TABS: ORAL_TABLET | ORAL | 3 refills | Status: DC

## 2023-04-12 ENCOUNTER — Ambulatory Visit (INDEPENDENT_AMBULATORY_CARE_PROVIDER_SITE_OTHER): Payer: Medicare Other | Admitting: Family Medicine

## 2023-04-12 ENCOUNTER — Encounter: Payer: Self-pay | Admitting: Family Medicine

## 2023-04-12 VITALS — BP 160/80 | HR 82 | Ht 63.0 in | Wt 179.4 lb

## 2023-04-12 DIAGNOSIS — I10 Essential (primary) hypertension: Secondary | ICD-10-CM

## 2023-04-12 DIAGNOSIS — N951 Menopausal and female climacteric states: Secondary | ICD-10-CM

## 2023-04-12 DIAGNOSIS — Z1231 Encounter for screening mammogram for malignant neoplasm of breast: Secondary | ICD-10-CM | POA: Diagnosis not present

## 2023-04-12 NOTE — Progress Notes (Signed)
   SUBJECTIVE:   CHIEF COMPLAINT / HPI:  Erica Campbell is a 67 y.o. female with a pertinent past medical history of HTN, T2DM, anxiety, vaginal dryness presenting to the clinic for medication management.  Vaginal dryness Has been going on for a "very long time," was addressed at last appointment in July. May be somewhat better compared to last visit. Did not use recommended coconut oil as it was too strange for her, did continue with Aquaphor. Insurance did not cover estrogen Imvexxy cream, she is not sure how much it would cost out-of-pocket.  Hypertension BP: (!) 160/80 today after recheck. Home medications include: Atenolol, hydrochlorothiazide, losartan, amlodipine. She endorses taking these medications as prescribed. Does not check blood pressure at home. Diet: Does contain a fair amount of sodium. Exercise: Low. Patient has had a BMP in the past 1 year.  Diabetes Last A1c 7.6 in July 2024 Home CBGs: Not checking Medications: Metformin 500 mg BID, empagliflozin 25 mg daily Adherence: Good Eye exam: Overdue, deferred due to time Foot exam: Overdue, deferred due to time Microalbumin: Not due Statin: Lovastatin 20 mg daily No symptoms of hypoglycemia, polyuria, polydipsia, numbness extremities, foot ulcers/trauma  PERTINENT PMH / PSH: Currently taking care of her husband, Erica Campbell, severe dementia and has experienced multiple strokes.  She is feeling significant stress from this responsibility taken up significant portion of her life.  She feels she is having trouble coping.   OBJECTIVE:   BP (!) 160/80   Pulse 82   Ht 5\' 3"  (1.6 m)   Wt 179 lb 6.4 oz (81.4 kg)   SpO2 100%   BMI 31.78 kg/m   General: Age-appropriate, resting in chair, NAD, alert and at baseline. Cardiovascular: Regular rate and rhythm. Normal S1/S2. No murmurs, rubs, or gallops appreciated. 2+ radial pulses. Pulmonary: Clear bilaterally to ascultation. No increased WOB, no accessory muscle usage. No  wheezes, crackles, or rhonchi. Extremities: No peripheral edema bilaterally. Capillary refill <2 seconds. Psych: Tearful affect, anxious mood.  Pleasant, appropriate.  No SI/HI.   ASSESSMENT/PLAN:   Menopausal vaginal dryness Patient was previously unable to pick up estrogen cream due to cost.  Still having itching and irritation symptoms. -Recommended [facility] online pharmacy, where estrogen cream is $13.22 OTC.  Patient will reach out if she needs a prescription after she registers.  Essential hypertension Poorly controlled, however unclear if whitecoat or persistent. -Will repeat 24-hour BP monitor, last done in 2017, scheduled with Dr. Raymondo Band for 9/16 -Discontinue atenolol no other indications for beta-blocker -Will titrate medications as needed following 24-hour BP monitor  Ambulatory referral for mammogram placed, patient provided phone number to call to schedule.  Erica Wiggs Sharion Dove, MD [facility]

## 2023-04-12 NOTE — Assessment & Plan Note (Addendum)
Poorly controlled, however unclear if whitecoat or persistent. -Will repeat 24-hour BP monitor, last done in 2017, scheduled with Dr. Raymondo Band for 9/16 -Discontinue atenolol no other indications for beta-blocker -Will titrate medications as needed following 24-hour BP monitor

## 2023-04-12 NOTE — Patient Instructions (Addendum)
It was great to see you today! Thank you for choosing [facility] for your primary care.  Today we addressed:  Blood pressure We are stopping your atenolol today.  Please contact us if you experience any chest pain, including using the after hours line if it is after hours.  I have scheduled you with our pharmacist Dr. Raymondo Band on Monday 9/16 at 8:30 AM for a repeat 24-hour blood pressure monitor.  We can adjust her medications more afterwards.  Vaginal dryness/irritation You can sign up online with the [facility] pharmacy called [facility] where the medications would be affordable.  You can provide our information through the website and we will send in the prescription after you have signed up.  You need a mammogram to prevent breast cancer.  Please schedule an appointment.  You can call [phone].      You should return to our clinic as needed.  Thank you for coming to see [place] at [facility] and for the opportunity to care for you! Sharion Dove, Renise Gillies, MD 04/12/2023, 11:57 AM

## 2023-04-12 NOTE — Assessment & Plan Note (Signed)
Patient was previously unable to pick up estrogen cream due to cost.  Still having itching and irritation symptoms. -Recommended [facility] online pharmacy, where estrogen cream is $13.22 OTC.  Patient will reach out if she needs a prescription after she registers.

## 2023-04-19 ENCOUNTER — Ambulatory Visit (INDEPENDENT_AMBULATORY_CARE_PROVIDER_SITE_OTHER): Payer: Medicare Other | Admitting: Pharmacist

## 2023-04-19 ENCOUNTER — Encounter: Payer: Self-pay | Admitting: Pharmacist

## 2023-04-19 VITALS — BP 126/79 | Ht 63.0 in | Wt 180.6 lb

## 2023-04-19 DIAGNOSIS — I10 Essential (primary) hypertension: Secondary | ICD-10-CM

## 2023-04-19 NOTE — Progress Notes (Signed)
S:     Chief Complaint  Patient presents with   Medication Management    Ambulatory Cuff Monitoring Day 1   67 y.o. female who presents for hypertension evaluation, education, and management. Patient arrives in good spirits and presents without any assistance.  PMH is significant for HTN, DM, asthma, HLD, anxiety.  Patient was referred and last seen by Primary Care Provider, Dr. Sharion Dove, on 04/12/2023. At last visit, BP was elevated and patient reports does not check her BP at home.   Diagnosed with Hypertension in the year of 2014.    Medication compliance is reported to be optimal.  Discussed procedure for wearing the monitor and gave patient written instructions. Monitor was placed on non-dominant arm with instructions to return in the morning.   Current BP Medications include:  amlodipine 5 mg daily, hydrochlorothiazide 25 mg daily, losartan 100 mg daily  Antihypertensives tried in the past include: atenolol 100 mg daily, losartan-hydrochlorothiazide 100-25 mg daily  O:  Review of Systems  All other systems reviewed and are negative.   Physical Exam Constitutional:      Appearance: Normal appearance.  Pulmonary:     Effort: Pulmonary effort is normal.  Neurological:     Mental Status: She is alert.  Psychiatric:        Mood and Affect: Mood normal.        Behavior: Behavior normal.        Thought Content: Thought content normal.        Judgment: Judgment normal.    Last 3 Office BP readings: BP Readings from Last 3 Encounters:  04/12/23 (!) 160/80  02/11/23 138/85  05/11/22 139/81    Clinical Atherosclerotic Cardiovascular Disease (ASCVD): No  The 10-year ASCVD risk score (Arnett DK, et al., 2019) is: 28.6%   Values used to calculate the score:     Age: 1 years     Sex: Female     Is Non-Hispanic African American: Yes     Diabetic: Yes     Tobacco smoker: No     Systolic Blood Pressure: 160 mmHg     Is BP treated: Yes     HDL Cholesterol: 74 mg/dL      Total Cholesterol: 152 mg/dL  Basic Metabolic Panel    Component Value Date/Time   NA 139 02/11/2023 1145   NA 143 01/19/2014 1444   K 3.7 02/11/2023 1145   K 3.4 (L) 01/19/2014 1444   CL 100 02/11/2023 1145   CO2 23 02/11/2023 1145   CO2 29 01/19/2014 1444   GLUCOSE 174 (H) 02/11/2023 1145   GLUCOSE 151 (H) 12/16/2015 0954   GLUCOSE 148 (H) 01/19/2014 1444   BUN 15 02/11/2023 1145   BUN 13.5 01/19/2014 1444   CREATININE 0.72 02/11/2023 1145   CREATININE 0.74 12/16/2015 0954   CREATININE 0.9 01/19/2014 1444   CALCIUM 9.8 02/11/2023 1145   CALCIUM 9.7 01/19/2014 1444   GFRNONAA 82 01/29/2020 1254   GFRNONAA 88 12/16/2015 0954   GFRAA 94 01/29/2020 1254   GFRAA >89 12/16/2015 0954    Renal function: CrCl cannot be calculated (Patient's most recent lab result is older than the maximum 21 days allowed.).   ABPM Study Data: Arm Placement left arm  For Office Goal BP of <130/80 mmHg:  ABPM thresholds: Overall BP <125/75 mmHg, daytime BP <130/80 mmHg, sleeptime BP <110/65 mmHg   A/P: History of hypertension longstanding uncontrolled years currently taking antihypertensives; with goal presssure of <130 mmHg systolic.  Currently on 3 drug regimen:  amlodipine 5 mg daily, hydrochlorothiazide 25 mg daily, losartan 100 mg daily and reports adherence. -Placed blood pressure cuff, provided education, patient instructed to wear cuff for 24 hours and return tomorrow to review results.   Written patient instructions provided including activity/symptom/event log. Patient verbalized understanding of plan. Total time in face to face counseling 20 minutes.    Follow-up: Tomorrow AM - early morning appointment 8:30 AM  Patient seen with Alesia Banda, PharmD Candidate and Andee Poles, PharmD Candidate.

## 2023-04-19 NOTE — Patient Instructions (Signed)
Blood Pressure Activity Diary Time Lying down/ Sleeping Walking/ Exercise Stressed/ Angry Headache/ Pain Dizzy  9 AM       10 AM       11 AM       12 PM       1 PM       2 PM       Time Lying down/ Sleeping Walking/ Exercise Stressed/ Angry Headache/ Pain Dizzy  3 PM       4 PM        5 PM       6 PM       7 PM       8 PM       Time Lying down/ Sleeping Walking/ Exercise Stressed/ Angry Headache/ Pain Dizzy  9 PM       10 PM       11 PM       12 AM       1 AM       2 AM       3 AM       Time Lying down/ Sleeping Walking/ Exercise Stressed/ Angry Headache/ Pain Dizzy  4 AM       5 AM       6 AM       7 AM       8 AM       9 AM       10 AM        Time you woke up: _________                  Time you went to sleep:__________  Come back tomorrow at 8:30 AM to have the monitor removed  Call the [facility] if you have any questions before then ([phone])  Wearing the Blood Pressure Monitor The cuff will inflate every 20 minutes during the day and every 30 minutes while you sleep. Fill out the blood pressure-activity diary during the day, especially during activities that may affect your reading -- such as exercise, stress, walking, taking your blood pressure medications  Important things to know: Avoid taking the monitor off for the next 24 hours, unless it causes you discomfort or pain. Do NOT get the monitor wet and do NOT try to clean the monitor with any cleaning products. Do NOT put the monitor on anyone else's arm. When the cuff inflates, avoid excess movement. Let the cuffed arm hang loosely, slightly away from the body. Avoid flexing the muscles or moving the hand/fingers. Remember to fill out the blood pressure activity diary. If you experience severe pain or unusual pain (not associated with getting your blood pressure checked), remove the monitor.  Troubleshooting:  Code  Troubleshooting   1  Check cuff position, tighten cuff   2, 3  Remain  still during reading   4, 87  Check air hose connections and make sure cuff is tight   85, 89  Check hose connections and make tubing is not crimped   86  Push START/STOP to restart reading   88, 91  Retry by pushing START/STOP   90  Replace batteries. If problem persists, remove monitor and bring back to   clinic at follow up   97, 98, 99  Service required - Remove monitor and bring back to clinic at follow up

## 2023-04-19 NOTE — Assessment & Plan Note (Signed)
History of hypertension longstanding uncontrolled years currently taking antihypertensives; with goal presssure of <130 mmHg systolic.    Currently on 3 drug regimen:  amlodipine 5 mg daily, hydrochlorothiazide 25 mg daily, losartan 100 mg daily and reports adherence. -Placed blood pressure cuff, provided education, patient instructed to wear cuff for 24 hours and return tomorrow to review results.

## 2023-04-19 NOTE — Progress Notes (Signed)
Reviewed and agree with Dr Koval's plan.   

## 2023-04-20 ENCOUNTER — Ambulatory Visit (INDEPENDENT_AMBULATORY_CARE_PROVIDER_SITE_OTHER): Payer: Medicare Other | Admitting: Pharmacist

## 2023-04-20 ENCOUNTER — Encounter: Payer: Self-pay | Admitting: Pharmacist

## 2023-04-20 VITALS — BP 129/79 | Ht 63.0 in | Wt 180.6 lb

## 2023-04-20 DIAGNOSIS — I10 Essential (primary) hypertension: Secondary | ICD-10-CM

## 2023-04-20 NOTE — Assessment & Plan Note (Signed)
History of hypertension longstanding controlled currently taking three antihypertensives; with goal presssure of <130/80 mmHg. Probable white coat hypertension provided office visit blood pressures. Found to have normal blood pressure with 24-hour ambulatory blood pressure evaluation which demonstrates an average AWAKE blood pressure of 129/79 mmHg. Nocturnal dipping pattern is normal.  No changes to medications -Continued amlodipine 5 mg daily -Continued losartan 100 mg daily -Continued hydrochlorothiazide 25 mg daily

## 2023-04-20 NOTE — Progress Notes (Signed)
S:     Chief Complaint  Patient presents with   Medication Management    Ambulatory Monitoring - Day #2    67 y.o. female who presents for hypertension evaluation, education, and management.  Patient arrives in good spirits and presents without any assistance.  PMH is significant for HTN, DM, asthma, HLD, anxiety.  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 Patient returns to clinic with 24 hour blood pressure monitor and reports periodic stress moments. Patient reports they were able to wear the Ambulatory Blood Pressure Cuff for the entire 24 evaluation period. Reports taking amlodipine and losartan, but forgot to take hydrochlorothiazide.  O:  Review of Systems  All other systems reviewed and are negative.   Physical Exam Constitutional:      Appearance: Normal appearance.  Neurological:     Mental Status: She is alert.  Psychiatric:        Mood and Affect: Mood normal.        Behavior: Behavior normal.        Thought Content: Thought content normal.        Judgment: Judgment normal.    Last 3 Office BP readings: BP Readings from Last 3 Encounters:  04/19/23 126/79  04/12/23 (!) 160/80  02/11/23 138/85    Clinical Atherosclerotic Cardiovascular Disease (ASCVD): No  The 10-year ASCVD risk score (Arnett DK, et al., 2019) is: 18.3%   Values used to calculate the score:     Age: 53 years     Sex: Female     Is Non-Hispanic African American: Yes     Diabetic: Yes     Tobacco smoker: No     Systolic Blood Pressure: 126 mmHg     Is BP treated: Yes     HDL Cholesterol: 74 mg/dL     Total Cholesterol: 152 mg/dL  Basic Metabolic Panel    Component Value Date/Time   NA 139 02/11/2023 1145   NA 143 01/19/2014 1444   K 3.7 02/11/2023 1145   K 3.4 (L) 01/19/2014 1444   CL 100 02/11/2023 1145   CO2 23 02/11/2023 1145   CO2 29 01/19/2014 1444   GLUCOSE 174 (H) 02/11/2023 1145   GLUCOSE 151 (H) 12/16/2015 0954   GLUCOSE 148 (H) 01/19/2014 1444   BUN  15 02/11/2023 1145   BUN 13.5 01/19/2014 1444   CREATININE 0.72 02/11/2023 1145   CREATININE 0.74 12/16/2015 0954   CREATININE 0.9 01/19/2014 1444   CALCIUM 9.8 02/11/2023 1145   CALCIUM 9.7 01/19/2014 1444   GFRNONAA 82 01/29/2020 1254   GFRNONAA 88 12/16/2015 0954   GFRAA 94 01/29/2020 1254   GFRAA >89 12/16/2015 0954    Renal function: CrCl cannot be calculated (Patient's most recent lab result is older than the maximum 21 days allowed.).   ABPM Study Data: Arm Placement left arm  Overall Mean 24hr BP:   124/75 mmHg  HR: 109  Daytime Mean BP:  129/79 mmHg  HR: 113  Nighttime Mean BP:  111//64 mmHg  HR: 96  Dipping Pattern: Yes.    Sys:   14%   Dia: 19.1%   [normal dipping ~10-20%]  For Office Goal BP of <130/80 mmHg:  ABPM thresholds: Overall BP <125/75 mmHg, daytime BP <130/80 mmHg, sleeptime BP <110/65 mmHg   A/P: History of hypertension longstanding controlled currently taking three antihypertensives; with goal presssure of <130/80 mmHg. Probable white coat hypertension provided office visit blood pressures. Found to have normal blood pressure with  24-hour ambulatory blood pressure evaluation which demonstrates an average AWAKE blood pressure of 129/79 mmHg. Nocturnal dipping pattern is normal.  No changes to medications -Continued amlodipine 5 mg daily -Continued losartan 100 mg daily -Continued hydrochlorothiazide 25 mg daily  Results reviewed and written information provided.    Written patient instructions provided. Patient verbalized understanding of treatment plan.  Total time in face to face counseling 27 minutes.    Follow-up:  Pharmacist PRN PCP clinic visit in 4-6 weeks Patient seen with Alesia Banda, PharmD Candidate.

## 2023-04-20 NOTE — Patient Instructions (Addendum)
It was nice to see you today!  Thank you for completing the blood pressure monitoring evaluation.  Your goal blood pressure is < 130/80 mmHg   Likely elevated BP determined as white coat hypertension. BP looks good at home!  Medication Changes: Continue all other medication the same.  Monitor blood pressure at home and keep a log (on a piece of paper) to bring with you to your next visit.

## 2023-04-22 NOTE — Progress Notes (Signed)
Reviewed and agree with Dr Koval's plan.   

## 2023-04-30 ENCOUNTER — Other Ambulatory Visit: Payer: Self-pay | Admitting: Student

## 2023-04-30 DIAGNOSIS — F419 Anxiety disorder, unspecified: Secondary | ICD-10-CM

## 2023-05-05 ENCOUNTER — Other Ambulatory Visit: Payer: Self-pay | Admitting: Family Medicine

## 2023-05-05 DIAGNOSIS — J302 Other seasonal allergic rhinitis: Secondary | ICD-10-CM

## 2023-05-26 ENCOUNTER — Other Ambulatory Visit: Payer: Self-pay | Admitting: Student

## 2023-05-26 ENCOUNTER — Encounter: Payer: Self-pay | Admitting: Student

## 2023-05-26 DIAGNOSIS — F419 Anxiety disorder, unspecified: Secondary | ICD-10-CM

## 2023-05-27 ENCOUNTER — Ambulatory Visit: Payer: Medicare Other | Admitting: Student

## 2023-06-03 ENCOUNTER — Encounter: Payer: Self-pay | Admitting: Student

## 2023-06-03 ENCOUNTER — Ambulatory Visit: Payer: Medicare Other | Admitting: Student

## 2023-06-03 VITALS — BP 130/87 | HR 94 | Wt 183.1 lb

## 2023-06-03 DIAGNOSIS — E119 Type 2 diabetes mellitus without complications: Secondary | ICD-10-CM

## 2023-06-03 DIAGNOSIS — Z23 Encounter for immunization: Secondary | ICD-10-CM | POA: Diagnosis present

## 2023-06-03 DIAGNOSIS — W19XXXA Unspecified fall, initial encounter: Secondary | ICD-10-CM

## 2023-06-03 DIAGNOSIS — M792 Neuralgia and neuritis, unspecified: Secondary | ICD-10-CM

## 2023-06-03 DIAGNOSIS — G5603 Carpal tunnel syndrome, bilateral upper limbs: Secondary | ICD-10-CM

## 2023-06-03 DIAGNOSIS — I10 Essential (primary) hypertension: Secondary | ICD-10-CM | POA: Diagnosis not present

## 2023-06-03 LAB — POCT GLYCOSYLATED HEMOGLOBIN (HGB A1C): HbA1c, POC (controlled diabetic range): 7.9 % — AB (ref 0.0–7.0)

## 2023-06-03 MED ORDER — METFORMIN HCL 500 MG PO TABS: ORAL_TABLET | ORAL | 3 refills | Status: DC

## 2023-06-03 NOTE — Patient Instructions (Signed)
It was great to see you! Thank you for allowing me to participate in your care!   Our plans for today:  -metformin increase to 1000 at night, 500 in morning, I will reach out to pharmacy team about Jardiance cost -I will give you splint for your wrist which may be related related to carpal tunnel but I will also check a B12 level to make sure that is not causing this -Your A1c was 7.9 -Physical therapy referral was placed for your fall and for balance and gait training  Take care and seek immediate care sooner if you develop any concerns.  Levin Erp, MD

## 2023-06-03 NOTE — Progress Notes (Signed)
SUBJECTIVE:   CHIEF COMPLAINT / HPI: Follow up   Numbness in her fingers  Bilaterally. Worse on the right hand. She has this more frequently over the past few months.  Worse at nighttime when she is going to bed.  Genia Hotter last week --unsure what happened, did not hit head or loss conciousness, no lightheaded or palpitations before. She believes she may have stumbled on her leg. She states she thinks she has been having more balance issues lately.  Hypertension: Patient is a 67 y.o. female who present today for follow up of hypertension. Recent ambulatory BP monitor with probable white coat hypertension and no changes to meds.  Patient endorses no problems.  Atenolol was stopped previously.  States that she feels like atenolol initially had some rebound palpitations but noticed that she had switched her coffee brand and after she had gone back to her regular these stopped.  Home medications include: hydrochlorothiazide 25 mg daily, losartan 100 mg daily, amlodipine 5 mg daily Patient endorses taking these medications as prescribed. Denies any headache, vision changes, shortness of breath, lower extremity swelling or chest pain   Most recent creatinine trend:  Lab Results  Component Value Date   CREATININE 0.72 02/11/2023   CREATININE 0.78 05/11/2022   CREATININE 0.88 02/19/2021    Diabetic Follow Up: Patient is a 67 y.o. female who present today for diabetic follow up.   Patient endorses issues obtaining jardiance-going up to 600 dollars in January--has been off of this for 2 weeks or more. Feels more heavy after stopping.  Home medications include: jardiance 25 mg daily, metformin 500 BID ACEi/ARB: yes losartan 100 mg daily Statin: yes lovastatin 40 mg daily Patient endorses taking these medications as prescribed.  Most recent A1Cs:  Lab Results  Component Value Date   HGBA1C 7.9 (A) 06/03/2023   HGBA1C 7.6 (A) 02/11/2023   HGBA1C 7.4 (A) 05/11/2022   Last  Microalbumin, LDL, Creatinine: Lab Results  Component Value Date   LDLCALC 59 02/11/2023   CREATININE 0.72 02/11/2023    PERTINENT  PMH / PSH: hx of breast neoplasm with mastectomy  OBJECTIVE:   BP 130/87   Pulse 94   Wt 183 lb 2 oz (83.1 kg)   SpO2 100%   BMI 32.44 kg/m   General: Well appearing, NAD, awake, alert, responsive to questions Head: Normocephalic atraumatic CV: Regular rate and rhythm no murmurs rubs or gallops Respiratory: Clear to ausculation bilaterally, no wheezes rales or crackles, chest rises symmetrically,  no increased work of breathing Extremities: Moves upper and lower extremities freely, + phalen test, + tinel's test  ASSESSMENT/PLAN:   Assessment & Plan Controlled type 2 diabetes mellitus without complication, without long-term current use of insulin (HCC) A1c today 7.9.  Goal of less than 8.  Has been off of Jardiance for the last couple weeks due to cost.  I will message pharmacy about this. -Increase metformin to 1000 mg at nighttime and 500 mg at breakfast -A1c recheck in 3 months -Message pharmacy about Jardiance Essential hypertension Patient's blood pressure is controlled today. BP: 130/87. Goal of  SBP <140 . Patient's medication regimen includes hydrochlorothiazide 25 mg daily, losartan 100 mg daily, amlodipine 5 mg daily. Recent ambulatory BP cuff showed likely white coat HTN.  -3 month f/u Neuropathic pain Neuropathic pain in fingers bilaterally. +Phalen/Tinels possible carpal tunnel.  On metformin so we will also check B12 levels as well. -B12 -Wrist splint for carpal tunnel -Continue home gabapentin  Fall, initial  encounter Fall 1 week ago without loss of consciousness, head injury or syncopal symptoms. Witnessed by her husband's home PT. She feels that her balance is not as good as it had been previously.  Amenable to starting physical therapy for balance and gait training. -Physical therapy referral Encounter for immunization Flu  shot given today   Levin Erp, MD [facility]

## 2023-06-03 NOTE — Assessment & Plan Note (Signed)
A1c today 7.9.  Goal of less than 8.  Has been off of Jardiance for the last couple weeks due to cost.  I will message pharmacy about this. -Increase metformin to 1000 mg at nighttime and 500 mg at breakfast -A1c recheck in 3 months -Message pharmacy about [facility]

## 2023-06-03 NOTE — Assessment & Plan Note (Signed)
Patient's blood pressure is controlled today. BP: 130/87. Goal of SBP <140. Patient's medication regimen includes hydrochlorothiazide 25 mg daily, losartan 100 mg daily, amlodipine 5 mg daily. Recent ambulatory BP cuff showed likely white coat HTN.  -3 month f/u

## 2023-06-04 LAB — VITAMIN B12: Vitamin B-12: 432 pg/mL (ref 232–1245)

## 2023-06-08 ENCOUNTER — Telehealth: Payer: Self-pay | Admitting: Pharmacist

## 2023-06-08 NOTE — Telephone Encounter (Signed)
Patient contacted for follow-up of cost of Jardiance (empagliflozin)  When contacted patient reported she was trying higher dose metformin as directed by her PCP at last visit.  At this time she was uninterested in samples of Jardiance.   She shared she wanted to follow-up with her PCP prior to using short-term samples.   Total time with patient call and documentation of interaction: 11 minutes.

## 2023-06-09 ENCOUNTER — Encounter: Payer: Self-pay | Admitting: Student

## 2023-06-09 DIAGNOSIS — F419 Anxiety disorder, unspecified: Secondary | ICD-10-CM

## 2023-06-09 MED ORDER — GABAPENTIN 100 MG PO CAPS: ORAL_CAPSULE | ORAL | 0 refills | Status: DC

## 2023-06-09 NOTE — Telephone Encounter (Signed)
Reviewed and agree with Dr Koval's plan.   

## 2023-06-17 ENCOUNTER — Encounter: Payer: Self-pay | Admitting: Student

## 2023-06-21 ENCOUNTER — Telehealth: Payer: Self-pay | Admitting: Pharmacist

## 2023-06-21 NOTE — Telephone Encounter (Signed)
Follow-up call to ask patient if she is interested in receiving Jardiance (empagliflozin) 25 mg tablet samples.  Medication Samples have been provided to the patient.  Drug name: empagliflozin (Jardiance)       Strength: 25 mg        Qty: 5 boxes (35 tablets)   LOT: [id number]  Exp.Date: 04/02/2025  Dosing instructions: Take one (1) tablet once daily.   The patient has been instructed regarding the correct time, dose, and frequency of taking this medication, including desired effects and most common side effects.   Will continue to work on patient assistance application through [facility] cares for [place] coverage. Application is printed and ready for patient signature upon arrival for samples pickup.  Madelon Lips 2:30 PM 06/21/2023

## 2023-06-21 NOTE — Telephone Encounter (Signed)
Patient LVM on Friday afternoon regarding questions with samples on Jardiance.   Called patient back today. Patient reports that she received a phone call regarding Jardiance samples.   I am not able to find this in chart review.   Will forward to pharmacy team for further clarification.   Veronda Prude, RN

## 2023-06-21 NOTE — Telephone Encounter (Signed)
Reviewed and agree with Dr Koval's plan.   

## 2023-06-28 ENCOUNTER — Encounter: Payer: Self-pay | Admitting: Student

## 2023-06-28 NOTE — Telephone Encounter (Signed)
Patient presents to clinic for medication. Provided with medication per Dr. Raymondo Band.   Veronda Prude, RN

## 2023-06-29 ENCOUNTER — Other Ambulatory Visit (HOSPITAL_COMMUNITY): Payer: Self-pay

## 2023-06-29 ENCOUNTER — Telehealth: Payer: Self-pay

## 2023-06-29 NOTE — Telephone Encounter (Signed)
Rec'd completed patients portion of [employer] application for Jardiance assistance.   App placed in PCPs box for signature.

## 2023-07-06 ENCOUNTER — Other Ambulatory Visit (HOSPITAL_COMMUNITY): Payer: Self-pay

## 2023-07-06 NOTE — Progress Notes (Signed)
Pharmacy Medication Assistance Program Note    07/30/2023  Patient ID: Erica Campbell, female   DOB: [birth year], 67 y.o.   MRN: [id number]     06/29/2023  [employer]reach [employer]  Manufacturer Medication One [employer]  [employer] Drugs Jardiance  Dose of Jardiance 25mg   Type of Radiographer, therapeutic Assistance  Date Application Sent to Prescriber 06/29/2023  Name of Prescriber Levin Erp  Date Application Received From Provider 07/06/2023  Date Application Submitted to Manufacturer 07/06/2023  Method Application Sent to Manufacturer Fax  Patient Assistance Determination Approved  Approval Start Date 07/22/2023  Approval End Date 08/02/2024     New patient assistance.

## 2023-07-07 ENCOUNTER — Other Ambulatory Visit: Payer: Self-pay

## 2023-07-07 ENCOUNTER — Encounter: Payer: Self-pay | Admitting: Physical Therapy

## 2023-07-07 ENCOUNTER — Ambulatory Visit: Payer: Medicare Other | Attending: Family Medicine | Admitting: Physical Therapy

## 2023-07-07 DIAGNOSIS — W19XXXA Unspecified fall, initial encounter: Secondary | ICD-10-CM | POA: Diagnosis not present

## 2023-07-07 DIAGNOSIS — M25552 Pain in left hip: Secondary | ICD-10-CM

## 2023-07-07 DIAGNOSIS — M6281 Muscle weakness (generalized): Secondary | ICD-10-CM | POA: Diagnosis not present

## 2023-07-07 DIAGNOSIS — R2681 Unsteadiness on feet: Secondary | ICD-10-CM | POA: Diagnosis present
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
## 2023-07-19 ENCOUNTER — Encounter: Payer: Self-pay | Admitting: Student

## 2023-07-19 DIAGNOSIS — E119 Type 2 diabetes mellitus without complications: Secondary | ICD-10-CM

## 2023-07-20 MED ORDER — METFORMIN HCL 500 MG PO TABS: ORAL_TABLET | ORAL | 3 refills | Status: DC

## 2023-07-23 ENCOUNTER — Ambulatory Visit: Payer: Medicare Other

## 2023-07-29 ENCOUNTER — Ambulatory Visit: Payer: Medicare Other

## 2023-07-29 DIAGNOSIS — R2681 Unsteadiness on feet: Secondary | ICD-10-CM

## 2023-07-29 DIAGNOSIS — M6281 Muscle weakness (generalized): Secondary | ICD-10-CM

## 2023-07-29 DIAGNOSIS — M25552 Pain in left hip: Secondary | ICD-10-CM
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
## 2023-08-06 ENCOUNTER — Telehealth: Payer: Self-pay

## 2023-08-06 ENCOUNTER — Ambulatory Visit: Payer: Medicare Other | Attending: Family Medicine

## 2023-08-06 DIAGNOSIS — M6281 Muscle weakness (generalized): Secondary | ICD-10-CM | POA: Insufficient documentation

## 2023-08-06 DIAGNOSIS — M25552 Pain in left hip: Secondary | ICD-10-CM | POA: Insufficient documentation

## 2023-08-06 DIAGNOSIS — R2681 Unsteadiness on feet: Secondary | ICD-10-CM | POA: Insufficient documentation
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
## 2023-08-13 ENCOUNTER — Ambulatory Visit: Payer: Medicare Other

## 2023-08-20 ENCOUNTER — Ambulatory Visit: Payer: Medicare Other

## 2023-08-20 ENCOUNTER — Other Ambulatory Visit: Payer: Self-pay | Admitting: Student

## 2023-08-20 DIAGNOSIS — R2681 Unsteadiness on feet: Secondary | ICD-10-CM | POA: Diagnosis present

## 2023-08-20 DIAGNOSIS — M25552 Pain in left hip: Secondary | ICD-10-CM | POA: Diagnosis present

## 2023-08-20 DIAGNOSIS — M6281 Muscle weakness (generalized): Secondary | ICD-10-CM
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
## 2023-08-27 ENCOUNTER — Ambulatory Visit: Payer: Medicare Other

## 2023-08-27 DIAGNOSIS — R2681 Unsteadiness on feet: Secondary | ICD-10-CM

## 2023-08-27 DIAGNOSIS — M6281 Muscle weakness (generalized): Secondary | ICD-10-CM

## 2023-08-27 DIAGNOSIS — M25552 Pain in left hip: Secondary | ICD-10-CM
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
## 2023-08-31 ENCOUNTER — Ambulatory Visit: Payer: Medicare Other | Admitting: Student

## 2023-08-31 ENCOUNTER — Encounter: Payer: Self-pay | Admitting: Student

## 2023-08-31 VITALS — BP 155/84 | HR 113 | Ht 63.0 in | Wt 184.2 lb

## 2023-08-31 DIAGNOSIS — G5603 Carpal tunnel syndrome, bilateral upper limbs: Secondary | ICD-10-CM | POA: Diagnosis present

## 2023-08-31 DIAGNOSIS — L308 Other specified dermatitis: Secondary | ICD-10-CM | POA: Diagnosis not present

## 2023-08-31 DIAGNOSIS — F439 Reaction to severe stress, unspecified: Secondary | ICD-10-CM

## 2023-08-31 MED ORDER — TRIAMCINOLONE ACETONIDE 0.5 % EX OINT: 1.0000 | TOPICAL_OINTMENT | Freq: Two times a day (BID) | CUTANEOUS | 3 refills | Status: AC

## 2023-08-31 NOTE — Progress Notes (Signed)
    SUBJECTIVE:   CHIEF COMPLAINT / HPI:    Stress She is overwhelmed with the process of appealing for her husband to stay in the facility and is also in process of applying     08/31/2023    3:53 PM 06/03/2023   10:08 AM 04/12/2023   11:08 AM 02/11/2023   10:19 AM 10/27/2022   10:17 AM  Depression screen PHQ 2/9  Decreased Interest 1 0 0 0 0  Down, Depressed, Hopeless 1 0 0 0 0  PHQ - 2 Score 2 0 0 0 0  Altered sleeping 0 0 0 0   Tired, decreased energy 1 0 1 1   Change in appetite 1 0 1 1   Feeling bad or failure about yourself  0 0 0 0   Trouble concentrating 0 1 0 1   Moving slowly or fidgety/restless 0 0 0 0   Suicidal thoughts 0 0 0 0   PHQ-9 Score 4 1 2 3    Difficult doing work/chores Not difficult at all Not difficult at all Not difficult at all       Carpal tunnel symptoms Bilateral hand pain that she believes is due to carpal tunnel syndrome. The pain is primarily in the first three fingers of both hands, with the worst pain in the thumb. The pain is worse at night and is not relieved by splinting due to an allergic reaction to the material of the splint. She is currently taking gabapentin for the pain, but it is not providing relief. She also reports itching in various parts of her body, which she has been managing with triamcinolone cream.  PERTINENT  PMH / PSH: diabetes, white coat htn  OBJECTIVE:   BP (!) 155/84   Pulse (!) 113   Ht 5\' 3"  (1.6 m)   Wt 184 lb 3.2 oz (83.6 kg)   SpO2 98%   BMI 32.63 kg/m   General: tearful, awake, alert, responsive to questions Head: Normocephalic atraumatic Respiratory: chest rises symmetrically,  no increased work of breathing Extremities: Moves upper and lower extremities freely  ASSESSMENT/PLAN:   Assessment & Plan Bilateral carpal tunnel syndrome Both hands, worse on the right hand, with symptoms exacerbated at night. Unable to tolerate splinting due to skin irritation. -Referral to hand surgery  Stress at  home Patient is experiencing significant stress related to the care of her husband and father, both of whom have significant health issues. -Referral to social work for additional support - to help with medicaid application and appeal process of SNF Other eczema Itching and inflammation, particularly on the neck. -Triamcinolone    Levin Erp, MD [facility]

## 2023-08-31 NOTE — Patient Instructions (Addendum)
It was great to see you! Thank you for allowing me to participate in your care!   Our plans for today:  -I am placing referral to social work for help with medicaid application and appealing living facility with [facility] -I am placing referral for hand orthopedist to see if they may need to do injections versus a release -I have placed order for triamcinolone ointment -I would like to see you back in 1 month to see how things are going  Take care and seek immediate care sooner if you develop any concerns.  Levin Erp, MD

## 2023-09-01 ENCOUNTER — Telehealth: Payer: Self-pay | Admitting: *Deleted

## 2023-09-01 ENCOUNTER — Ambulatory Visit: Payer: Self-pay | Admitting: Licensed Clinical Social Worker

## 2023-09-01 ENCOUNTER — Ambulatory Visit: Payer: Medicare Other

## 2023-09-01 DIAGNOSIS — M25552 Pain in left hip: Secondary | ICD-10-CM

## 2023-09-01 DIAGNOSIS — M6281 Muscle weakness (generalized): Secondary | ICD-10-CM

## 2023-09-01 DIAGNOSIS — R2681 Unsteadiness on feet: Secondary | ICD-10-CM | POA: Diagnosis not present

## 2023-09-01 NOTE — Patient Instructions (Addendum)
Visit Information  Thank you for taking time to visit with me today. Please don't hesitate to contact me if I can be of assistance to you.   Following are the goals we discussed today:   Goals Addressed             This Visit's Progress    Care Coordiantion       Activities and task to complete in order to accomplish goals.   Start / continue relaxed breathing 3 times daily Self Support options  (engage in self care techniques to alleviate caregiver stress ) Contact [employer] and [facility] to assist with caregiver support if spouse is discharged.  Keep line of communication open with insurance company regarding appeal          Our next appointment is by telephone on 09/22/2023 at 1pm  Please call the care guide team at [phone] if you need to cancel or reschedule your appointment.   If you are experiencing a Mental Health or Behavioral Health Crisis or need someone to talk to, please call 911   Patient verbalizes understanding of instructions and care plan provided today and agrees to view in MyChart. Active MyChart status and patient understanding of how to access instructions and care plan via MyChart confirmed with patient.     Telephone follow up appointment with care management team member scheduled for:09/22/2023  Gwyndolyn Saxon MSW, LCSW Licensed Clinical Social Worker  [facility], Population Health Direct Dial: [phone]  Fax: [phone]

## 2023-09-01 NOTE — Progress Notes (Signed)
Complex Care Management Note  Care Guide Note 09/01/2023 Name: Abel Ra MRN: [id number] DOB: 01/01/1956  Ronee Ranganathan is a 68 y.o. year old female who sees Levin Erp, MD for primary care. I reached out to [employer] by phone today to offer complex care management services.  Ms. Stream was given information about Complex Care Management services today including:   The Complex Care Management services include support from the care team which includes your Nurse Coordinator, Clinical Social Worker, or Pharmacist.  The Complex Care Management team is here to help remove barriers to the health concerns and goals most important to you. Complex Care Management services are voluntary, and the patient may decline or stop services at any time by request to their care team member.   Complex Care Management Consent Status: Patient agreed to services and verbal consent obtained.   Follow up plan:  Telephone appointment with complex care management team member scheduled for:  1/29  Encounter Outcome:  Patient Scheduled  Gwenevere Ghazi  [facility] Health  [facility] Guide  Direct Dial: [phone]  Fax [phone]

## 2023-09-01 NOTE — Patient Outreach (Signed)
  Care Coordination   Initial Visit Note   09/01/2023 Name: Erica Campbell MRN: [id number] DOB: 1955/11/08  Erica Campbell is a 68 y.o. year old female who sees Levin Erp, MD for primary care. I spoke with  Oscar La by phone today.  What matters to the patients health and wellness today?  Provided caregiver support and community resources to prepare for pending discharge of spouse.     Goals Addressed             This Visit's Progress    Care Coordiantion       Activities and task to complete in order to accomplish goals.   Start / continue relaxed breathing 3 times daily Self Support options  (engage in self care techniques to alleviate caregiver stress ) Contact [employer] [facility] and [facility] Care to assist with caregiver support if spouse is discharged.  Keep line of communication open with insurance company regarding appeal          SDOH assessments and interventions completed:  No   SDOH completed 08/31/2023  Care Coordination Interventions:  Yes, provided  Interventions Today    Flowsheet Row Most Recent Value  General Interventions   General Interventions Discussed/Reviewed Level of Care  Level of Care Personal Care Services  [Advise Ms. Baltazar of [facility] and [facility] to assist with caregiver tasks.]  Mental Health Interventions   Mental Health Discussed/Reviewed Mental Health Reviewed, Coping Strategies  [Provided emotional support and stratgies.]       Follow up plan: Follow up call scheduled for 09/22/2023    Encounter Outcome:  Patient Visit Completed   Gwyndolyn Saxon MSW, LCSW Licensed Clinical Social Worker  [facility], Population Health Direct Dial: [phone]  Fax: [phone]
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
## 2023-09-02 ENCOUNTER — Other Ambulatory Visit: Payer: Self-pay | Admitting: Student

## 2023-09-02 DIAGNOSIS — F419 Anxiety disorder, unspecified: Secondary | ICD-10-CM

## 2023-09-03 ENCOUNTER — Other Ambulatory Visit: Payer: Self-pay | Admitting: Student

## 2023-09-03 MED ORDER — GABAPENTIN 100 MG PO CAPS: ORAL_CAPSULE | ORAL | 0 refills | Status: DC

## 2023-09-10 ENCOUNTER — Ambulatory Visit: Payer: Medicare Other | Attending: Family Medicine

## 2023-09-10 DIAGNOSIS — M25552 Pain in left hip: Secondary | ICD-10-CM | POA: Insufficient documentation

## 2023-09-10 DIAGNOSIS — R2681 Unsteadiness on feet: Secondary | ICD-10-CM | POA: Diagnosis present

## 2023-09-10 DIAGNOSIS — M6281 Muscle weakness (generalized): Secondary | ICD-10-CM | POA: Diagnosis present
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
## 2023-09-17 ENCOUNTER — Ambulatory Visit: Payer: Medicare Other

## 2023-09-22 ENCOUNTER — Ambulatory Visit: Payer: Self-pay | Admitting: Licensed Clinical Social Worker

## 2023-09-22 NOTE — Patient Instructions (Signed)
Visit Information  Thank you for taking time to visit with me today. Please don't hesitate to contact me if I can be of assistance to you.   Following are the goals we discussed today:   Goals Addressed             This Visit's Progress    COMPLETED: Care Coordiantion       Activities and task to complete in order to accomplish goals.   Start / continue relaxed breathing 3 times daily Self Support options  (engage in self care techniques to alleviate caregiver stress ) Contact [employer] [facility] and [facility] to assist with caregiver support if spouse is discharged.  Keep line of communication open with insurance company regarding appeal   Reviewed goals with pt 09/22/2023.  Goals completed pt reports no further concerns          Please call the care guide team at [phone] if you need to cancel or reschedule your appointment.   If you are experiencing a Mental Health or Behavioral Health Crisis or need someone to talk to, please call 911   Patient verbalizes understanding of instructions and care plan provided today and agrees to view in MyChart. Active MyChart status and patient understanding of how to access instructions and care plan via MyChart confirmed with patient.     The patient has been provided with contact information for the care management team and has been advised to call with any health related questions or concerns.   Gwyndolyn Saxon MSW, LCSW Licensed Clinical Social Worker  [facility], Population Health Direct Dial: [phone]  Fax: [phone]

## 2023-09-22 NOTE — Patient Outreach (Signed)
  Care Coordination   Follow Up Visit Note   09/22/2023 Name: Sharne Linders MRN: [id number] DOB: 10/25/1955  Raneisha Bress is a 68 y.o. year old female who sees Levin Erp, MD for primary care. I spoke with  Oscar La by phone today.  What matters to the patients health and wellness today?  Follow up completed with Pt R L[place]s. Mrs. Schuknecht spouse is home and she is adjusting well and have assistance in the home for spouse. Pt reports she has no other concerns.     Goals Addressed             This Visit's Progress    COMPLETED: Care Coordiantion       Activities and task to complete in order to accomplish goals.   Start / continue relaxed breathing 3 times daily Self Support options  (engage in self care techniques to alleviate caregiver stress ) Contact [employer] [facility] and [facility] to assist with caregiver support if spouse is discharged.  Keep line of communication open with insurance company regarding appeal   Reviewed goals with pt 09/22/2023.  Goals completed pt reports no further concerns        SDOH assessments and interventions completed:  Yes  SDOH Interventions Today    Flowsheet Row Most Recent Value  SDOH Interventions   Food Insecurity Interventions Intervention Not Indicated  Housing Interventions Intervention Not Indicated  Transportation Interventions Intervention Not Indicated  Utilities Interventions Intervention Not Indicated        Care Coordination Interventions:  Yes, provided  Interventions Today    Flowsheet Row Most Recent Value  General Interventions   General Interventions Discussed/Reviewed Community Resources  [Pt express understanding of afforable home health agencies.]       Follow up plan: No further intervention required.   Encounter Outcome:  Patient Visit Completed   Gwyndolyn Saxon MSW, LCSW Licensed Clinical Social Worker  [facility], Population Health Direct Dial: [phone]   Fax: [phone]

## 2023-09-24 ENCOUNTER — Ambulatory Visit: Payer: Medicare Other

## 2023-09-24 ENCOUNTER — Encounter: Payer: Self-pay | Admitting: Student

## 2023-09-24 DIAGNOSIS — M25552 Pain in left hip: Secondary | ICD-10-CM

## 2023-09-24 DIAGNOSIS — R2681 Unsteadiness on feet: Secondary | ICD-10-CM

## 2023-09-24 DIAGNOSIS — M6281 Muscle weakness (generalized): Secondary | ICD-10-CM
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
## 2023-09-25 ENCOUNTER — Other Ambulatory Visit: Payer: Self-pay | Admitting: Student

## 2023-09-25 DIAGNOSIS — F419 Anxiety disorder, unspecified: Secondary | ICD-10-CM

## 2023-10-01 ENCOUNTER — Ambulatory Visit: Payer: Medicare Other

## 2023-10-01 DIAGNOSIS — R2681 Unsteadiness on feet: Secondary | ICD-10-CM

## 2023-10-01 DIAGNOSIS — M25552 Pain in left hip: Secondary | ICD-10-CM

## 2023-10-01 DIAGNOSIS — M6281 Muscle weakness (generalized): Secondary | ICD-10-CM
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
## 2023-10-04 ENCOUNTER — Other Ambulatory Visit: Payer: Self-pay | Admitting: Student

## 2023-10-06 MED ORDER — LOSARTAN POTASSIUM 100 MG PO TABS: 100.0000 mg | ORAL_TABLET | Freq: Every day | ORAL | 0 refills | Status: DC

## 2023-10-07 ENCOUNTER — Ambulatory Visit: Payer: Medicare Other | Admitting: Student

## 2023-10-07 DIAGNOSIS — F419 Anxiety disorder, unspecified: Secondary | ICD-10-CM

## 2023-10-07 DIAGNOSIS — E119 Type 2 diabetes mellitus without complications: Secondary | ICD-10-CM

## 2023-10-07 DIAGNOSIS — Z7984 Long term (current) use of oral hypoglycemic drugs: Secondary | ICD-10-CM

## 2023-10-07 DIAGNOSIS — Z9181 History of falling: Secondary | ICD-10-CM

## 2023-10-07 DIAGNOSIS — G5603 Carpal tunnel syndrome, bilateral upper limbs: Secondary | ICD-10-CM | POA: Diagnosis present

## 2023-10-07 NOTE — Assessment & Plan Note (Addendum)
 Improved today-has been feeling much less stressed out with [place] home -monitor

## 2023-10-07 NOTE — Progress Notes (Signed)
 [facility] Telemedicine Visit  Patient consented to have virtual visit and was identified by name and date of birth. Method of visit: Video  Encounter participants: Patient: Bricelyn Freestone - located at home Provider: Levin Erp - located at [facility] Others (if applicable): Alieah Brinton (husband)  Chief Complaint: Follow-up  HPI:  Plan for in person visit today however had virtual visit earlier today with husband and preferred to virtual visit in AM to consolidate trips  Carpal tunnel Getting her nerve study April 11th.  Used to be improving somewhat but still feels like she is dropping stuff due to pain. Having to use less gabapentin.  Stress  Been better last time.  Was very stressed at last visit due to [facility] leaving SNF but has now gotten used to having and at home again and is in a good place.  Itching Around neck has improved with triamcinolone.   Diabetes Off of jardiance for a bit now. Has been doing metformin conistently. Due for A1c.  Hx of Fall No more falls, d/c'd from PT who helped her with a lot of navigating her home.  She has had no falls since PT introduced.  ROS: per HPI  Pertinent PMHx: IBS, T2DM  Exam:  There were no vitals taken for this visit.  Respiratory: Speaking in full sentences, no increased work of breathing on room air  Assessment/Plan:  Assessment & Plan Controlled type 2 diabetes mellitus without complication, without long-term current use of insulin (HCC) -Continue metformin, has not been able to acquire Jardiance -Future A1c ordered, patient instructed to set up future lab appointment History of fall Resolved after PT treatment Bilateral carpal tunnel syndrome EMG being set up soon. -F/u with specialists Anxiety Improved today-has been feeling much less stressed out with Benny home -monitor   Rash Resovled s/p triamcinolone  Time spent during visit with patient: 30 minutes

## 2023-10-07 NOTE — Assessment & Plan Note (Signed)
-  Continue metformin, has not been able to acquire Jardiance -Future A1c ordered, patient instructed to set up future lab appointment

## 2023-11-01 ENCOUNTER — Other Ambulatory Visit

## 2023-11-01 DIAGNOSIS — E119 Type 2 diabetes mellitus without complications: Secondary | ICD-10-CM

## 2023-11-01 DIAGNOSIS — Z7984 Long term (current) use of oral hypoglycemic drugs: Secondary | ICD-10-CM | POA: Diagnosis present

## 2023-11-01 LAB — POCT GLYCOSYLATED HEMOGLOBIN (HGB A1C): HbA1c, POC (controlled diabetic range): 7.9 % — AB (ref 0.0–7.0)

## 2023-11-02 ENCOUNTER — Encounter: Payer: Self-pay | Admitting: Student

## 2023-11-02 DIAGNOSIS — E119 Type 2 diabetes mellitus without complications: Secondary | ICD-10-CM

## 2023-11-02 MED ORDER — METFORMIN HCL 500 MG PO TABS: 1000.0000 mg | ORAL_TABLET | Freq: Two times a day (BID) | ORAL | 3 refills | Status: DC

## 2023-11-04 ENCOUNTER — Encounter: Payer: Self-pay | Admitting: Student

## 2023-11-09 ENCOUNTER — Encounter: Payer: Self-pay | Admitting: Student

## 2023-12-02 ENCOUNTER — Ambulatory Visit: Payer: Medicare Other

## 2023-12-02 VITALS — Wt 177.0 lb

## 2023-12-02 DIAGNOSIS — Z0000 Encounter for general adult medical examination without abnormal findings: Secondary | ICD-10-CM | POA: Diagnosis not present

## 2023-12-02 NOTE — Progress Notes (Signed)
 Spoke with patient. Made appt for 5/8 at 2:10 to go over care gaps. Linnie Riches, CMA

## 2023-12-02 NOTE — Patient Instructions (Signed)
 Ms. Bartelme , Thank you for taking time to come for your Medicare Wellness Visit. I appreciate your ongoing commitment to your health goals. Please review the following plan we discussed and let me know if I can assist you in the future.   Referrals/Orders/Follow-Ups/Clinician Recommendations: Yes, keep maintaining your health by keeping your appointments with Dr. Genora Kidd and any specialists that you may see.  Call us  if you need anything.  Have a great year!!!!  Recommendations from Nurse Linell Shawn: Aim for 30 minutes of exercise or brisk walking 5 days per week.  Drink 6-8 glasses of water each day. Eat 5-6 servings of fresh vegetables and fruits per day. Continue to do brain exercises such as reading, puzzles, games on your phone to help keep the brain sharp and active.  This is a list of the screening recommended for you and due dates:  Health Maintenance  Topic Date Due   Zoster (Shingles) Vaccine (1 of 2) Never done   Colon Cancer Screening  01/22/2014   Complete foot exam   12/15/2016   Mammogram  07/16/2021   DTaP/Tdap/Td vaccine (2 - Td or Tdap) 01/14/2023   Eye exam for diabetics  02/06/2023   COVID-19 Vaccine (5 - 2024-25 season) 04/04/2023   Yearly kidney health urinalysis for diabetes  05/12/2023   Yearly kidney function blood test for diabetes  02/11/2024   Flu Shot  03/03/2024   Hemoglobin A1C  05/02/2024   Medicare Annual Wellness Visit  12/01/2024   Pneumonia Vaccine  Completed   DEXA scan (bone density measurement)  Completed   Hepatitis C Screening  Completed   HPV Vaccine  Aged Out   Meningitis B Vaccine  Aged Out    Advanced directives: (Declined) Advance directive discussed with you today. Even though you declined this today, please call our office should you change your mind, and we can give you the proper paperwork for you to fill out.  Next Medicare Annual Wellness Visit scheduled for next year: Yes
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
## 2023-12-09 ENCOUNTER — Encounter: Payer: Self-pay | Admitting: Student

## 2023-12-09 ENCOUNTER — Ambulatory Visit (INDEPENDENT_AMBULATORY_CARE_PROVIDER_SITE_OTHER): Admitting: Student

## 2023-12-09 VITALS — BP 130/74 | HR 104 | Ht 63.0 in | Wt 183.4 lb

## 2023-12-09 DIAGNOSIS — G629 Polyneuropathy, unspecified: Secondary | ICD-10-CM

## 2023-12-09 DIAGNOSIS — E119 Type 2 diabetes mellitus without complications: Secondary | ICD-10-CM

## 2023-12-09 DIAGNOSIS — Z1211 Encounter for screening for malignant neoplasm of colon: Secondary | ICD-10-CM | POA: Diagnosis not present

## 2023-12-09 DIAGNOSIS — Z0000 Encounter for general adult medical examination without abnormal findings: Secondary | ICD-10-CM | POA: Diagnosis not present

## 2023-12-09 MED ORDER — DICYCLOMINE HCL 10 MG PO CAPS: 10.0000 mg | ORAL_CAPSULE | Freq: Three times a day (TID) | ORAL | 0 refills | Status: AC | PRN

## 2023-12-09 NOTE — Addendum Note (Signed)
 Addended by: Batul Diego on: 12/09/2023 03:51 PM   Modules accepted: Orders

## 2023-12-09 NOTE — Assessment & Plan Note (Signed)
 Has been having some neuropathy in the fingers likely from carpal tunnel that is known also having some in toes. - Check B12 and BMP

## 2023-12-09 NOTE — Addendum Note (Signed)
 Addended by: Sabrinia Prien on: 12/09/2023 03:36 PM   Modules accepted: Orders

## 2023-12-09 NOTE — Progress Notes (Signed)
    SUBJECTIVE:   CHIEF COMPLAINT / HPI: Follow up for care gaps  Discussed the use of AI scribe software for clinical note transcription with the patient, who gave verbal consent to proceed.  History of Present Illness Erica Campbell is a 68 year old female who presents for a follow-up visit to discuss her current health concerns and screenings.  She experiences frequent coughing, occurring every thirty seconds. She is reluctant to undergo a colonoscopy and mammogram due to past negative experiences with the healthcare system and prefers addressing issues as she arises.  She has been off Jardiance  for three months and is currently taking metformin  2000 mg daily and losartan . She experiences numbness and tingling in her feet, suggestive of neuropathy, and associates these symptoms with metformin  use.  She has carpal tunnel syndrome and wears a night splint on her right arm.  PERTINENT  PMH / PSH: HTN, asthma, T2DM, history of breast cancer  OBJECTIVE:   BP 130/74   Pulse (!) 104   Ht 5\' 3"  (1.6 m)   Wt 183 lb 6.4 oz (83.2 kg)   SpO2 100%   BMI 32.49 kg/m   General: Well appearing, NAD, awake, alert, responsive to questions Head: Normocephalic atraumatic Respiratory: chest rises symmetrically,  no increased work of breathing Extremities: Moves upper and lower extremities freely Diabetic Foot Exam - Simple   Simple Foot Form Visual Inspection No deformities, no ulcerations, no other skin breakdown bilaterally: Yes Sensation Testing Intact to touch and monofilament testing bilaterally: Yes Pulse Check Posterior Tibialis and Dorsalis pulse intact bilaterally: Yes Comments      ASSESSMENT/PLAN:   Assessment & Plan Controlled type 2 diabetes mellitus without complication, without long-term current use of insulin (HCC) A1c 1 month ago 7.9. - BMP - Annual urine microalbumin/creatinine - Ophthalmology referral Neuropathy Has been having some neuropathy in the fingers likely  from carpal tunnel that is known also having some in toes. - Check B12 and BMP Screening for colon cancer She is very hesitant to undergo colonoscopy, is agreeable to Cologuard screening. - Ordered Cologuard Healthcare maintenance Very hesitant about a lot of screenings including mammography, discussed she does have a history of breast cancer and would recommend screening but she would like to hold off for now.  Patient to let me know if she changes her mind on this.    Genora Kidd, MD [facility]

## 2023-12-09 NOTE — Patient Instructions (Addendum)
 It was great to see you! Thank you for allowing me to participate in your care!   Our plans for today:  - We will check some labs today - I have placed a referral to ophthalmology -if you do not hear back in the next 3 to 4 weeks please give us  a call - Please let me know if you are ever interested in Testing - I will order a cologaurd test for you  Take care and seek immediate care sooner if you develop any concerns.  Genora Kidd, MD

## 2023-12-09 NOTE — Assessment & Plan Note (Signed)
 A1c 1 month ago 7.9. - BMP - Annual urine microalbumin/creatinine - Ophthalmology referral

## 2023-12-10 ENCOUNTER — Telehealth: Payer: Self-pay

## 2023-12-10 ENCOUNTER — Encounter: Payer: Self-pay | Admitting: Student

## 2023-12-10 DIAGNOSIS — E119 Type 2 diabetes mellitus without complications: Secondary | ICD-10-CM

## 2023-12-10 LAB — BASIC METABOLIC PANEL WITH GFR
BUN/Creatinine Ratio: 15 (ref 12–28)
BUN: 11 mg/dL (ref 8–27)
CO2: 25 mmol/L (ref 20–29)
Calcium: 10.2 mg/dL (ref 8.7–10.3)
Chloride: 100 mmol/L (ref 96–106)
Creatinine, Ser: 0.72 mg/dL (ref 0.57–1.00)
Glucose: 285 mg/dL — ABNORMAL HIGH (ref 70–99)
Potassium: 4.1 mmol/L (ref 3.5–5.2)
Sodium: 142 mmol/L (ref 134–144)
eGFR: 91 mL/min/{1.73_m2} (ref >59)

## 2023-12-10 LAB — VITAMIN B12: Vitamin B-12: 437 pg/mL (ref 232–1245)

## 2023-12-10 LAB — MICROALBUMIN / CREATININE URINE RATIO
Creatinine, Urine: 114.1 mg/dL
Microalb/Creat Ratio: 13 mg/g{creat} (ref 0–29)
Microalbumin, Urine: 14.5 ug/mL

## 2023-12-10 MED ORDER — EMPAGLIFLOZIN 25 MG PO TABS: 25.0000 mg | ORAL_TABLET | Freq: Every day | ORAL | 0 refills | Status: AC

## 2023-12-10 NOTE — Telephone Encounter (Signed)
 Pharmacy Patient Advocate Encounter   Received notification from CoverMyMeds that prior authorization for DICYCLOMINE  is required/requested.   Insurance verification completed.   The patient is insured through [facility] .   PA required; PA submitted to above mentioned insurance via CoverMyMeds Key/confirmation #/EOC [id number]. Status is pending

## 2023-12-13 NOTE — Telephone Encounter (Signed)
 Pharmacy Patient Advocate Encounter  Received notification from [facility] MEDICARE that Prior Authorization for DICYCLOMINE  has been APPROVED until 12/09/24   PA #/Case ID/Reference #: [id number]

## 2024-01-11 ENCOUNTER — Encounter: Payer: Self-pay | Admitting: *Deleted

## 2024-01-12 ENCOUNTER — Encounter: Payer: Self-pay | Admitting: Student

## 2024-01-13 MED ORDER — AMLODIPINE BESYLATE 5 MG PO TABS: 5.0000 mg | ORAL_TABLET | Freq: Every day | ORAL | 0 refills | Status: DC

## 2024-01-14 ENCOUNTER — Other Ambulatory Visit: Payer: Self-pay | Admitting: Student

## 2024-01-14 DIAGNOSIS — F419 Anxiety disorder, unspecified: Secondary | ICD-10-CM

## 2024-01-16 ENCOUNTER — Other Ambulatory Visit: Payer: Self-pay | Admitting: Student

## 2024-01-16 DIAGNOSIS — E119 Type 2 diabetes mellitus without complications: Secondary | ICD-10-CM

## 2024-01-18 ENCOUNTER — Other Ambulatory Visit (HOSPITAL_COMMUNITY): Payer: Self-pay

## 2024-02-09 ENCOUNTER — Encounter: Payer: Self-pay | Admitting: Family Medicine

## 2024-02-09 MED ORDER — MECLIZINE HCL 12.5 MG PO TABS: 25.0000 mg | ORAL_TABLET | Freq: Three times a day (TID) | ORAL | 0 refills | Status: AC | PRN

## 2024-02-09 NOTE — Telephone Encounter (Signed)
 Chart reviewed. Rx refilled.

## 2024-02-14 LAB — HM DIABETES EYE EXAM
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
## 2024-02-17 ENCOUNTER — Encounter: Payer: Self-pay | Admitting: Family Medicine

## 2024-02-17 DIAGNOSIS — Z1211 Encounter for screening for malignant neoplasm of colon: Secondary | ICD-10-CM

## 2024-02-21 ENCOUNTER — Encounter (INDEPENDENT_AMBULATORY_CARE_PROVIDER_SITE_OTHER): Payer: Self-pay | Admitting: Ophthalmology

## 2024-02-21 ENCOUNTER — Other Ambulatory Visit: Payer: Self-pay | Admitting: *Deleted

## 2024-02-21 DIAGNOSIS — J302 Other seasonal allergic rhinitis: Secondary | ICD-10-CM

## 2024-02-21 DIAGNOSIS — H3581 Retinal edema: Secondary | ICD-10-CM

## 2024-02-21 MED ORDER — ALBUTEROL SULFATE HFA 108 (90 BASE) MCG/ACT IN AERS: INHALATION_SPRAY | RESPIRATORY_TRACT | 1 refills | Status: DC

## 2024-02-21 NOTE — Telephone Encounter (Signed)
 Chart reviewed. Rx refilled.

## 2024-02-28 ENCOUNTER — Other Ambulatory Visit: Payer: Self-pay

## 2024-02-28 ENCOUNTER — Encounter (INDEPENDENT_AMBULATORY_CARE_PROVIDER_SITE_OTHER): Payer: Self-pay

## 2024-02-28 ENCOUNTER — Encounter (INDEPENDENT_AMBULATORY_CARE_PROVIDER_SITE_OTHER): Admitting: Ophthalmology

## 2024-02-28 DIAGNOSIS — H3581 Retinal edema: Secondary | ICD-10-CM

## 2024-02-28 DIAGNOSIS — F419 Anxiety disorder, unspecified: Secondary | ICD-10-CM

## 2024-02-29 MED ORDER — GABAPENTIN 100 MG PO CAPS: ORAL_CAPSULE | ORAL | 0 refills | Status: DC

## 2024-02-29 NOTE — Telephone Encounter (Signed)
 Chart reviewed. Rx refilled.

## 2024-03-06 ENCOUNTER — Other Ambulatory Visit: Payer: Self-pay | Admitting: Family Medicine

## 2024-03-06 ENCOUNTER — Other Ambulatory Visit: Payer: Self-pay | Admitting: *Deleted

## 2024-03-06 DIAGNOSIS — Z1231 Encounter for screening mammogram for malignant neoplasm of breast: Secondary | ICD-10-CM

## 2024-03-06 NOTE — Progress Notes (Signed)
 Screening mammogram ordered

## 2024-03-07 MED ORDER — AMLODIPINE BESYLATE 5 MG PO TABS: 5.0000 mg | ORAL_TABLET | Freq: Every day | ORAL | 0 refills | Status: DC

## 2024-03-07 NOTE — Telephone Encounter (Signed)
 Chart reviewed. Rx refilled.
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
## 2024-03-15 ENCOUNTER — Encounter

## 2024-03-15 ENCOUNTER — Telehealth: Payer: Self-pay

## 2024-03-15 NOTE — Telephone Encounter (Signed)
 Mutiple attempts made to complete PV. Unable to reach patient. VM left. Pt to call the office back by 5 PM to have PV rescheduled. Pt made aware that in the event that we do not hear back from them their PV and scheduled procedure will be cancelled.

## 2024-03-16 ENCOUNTER — Other Ambulatory Visit: Payer: Self-pay | Admitting: Family Medicine

## 2024-03-16 DIAGNOSIS — Z1231 Encounter for screening mammogram for malignant neoplasm of breast: Secondary | ICD-10-CM

## 2024-03-27 ENCOUNTER — Ambulatory Visit (INDEPENDENT_AMBULATORY_CARE_PROVIDER_SITE_OTHER): Admitting: Ophthalmology

## 2024-03-27 ENCOUNTER — Encounter (INDEPENDENT_AMBULATORY_CARE_PROVIDER_SITE_OTHER): Payer: Self-pay | Admitting: Ophthalmology

## 2024-03-27 DIAGNOSIS — H353132 Nonexudative age-related macular degeneration, bilateral, intermediate dry stage: Secondary | ICD-10-CM | POA: Diagnosis not present

## 2024-03-27 DIAGNOSIS — H35033 Hypertensive retinopathy, bilateral: Secondary | ICD-10-CM | POA: Diagnosis not present

## 2024-03-27 DIAGNOSIS — I10 Essential (primary) hypertension: Secondary | ICD-10-CM

## 2024-03-27 DIAGNOSIS — E119 Type 2 diabetes mellitus without complications: Secondary | ICD-10-CM

## 2024-03-27 DIAGNOSIS — H3581 Retinal edema: Secondary | ICD-10-CM

## 2024-03-27 DIAGNOSIS — H25813 Combined forms of age-related cataract, bilateral: Secondary | ICD-10-CM

## 2024-03-27 DIAGNOSIS — Z7984 Long term (current) use of oral hypoglycemic drugs: Secondary | ICD-10-CM

## 2024-03-29 ENCOUNTER — Encounter (INDEPENDENT_AMBULATORY_CARE_PROVIDER_SITE_OTHER): Payer: Self-pay | Admitting: Ophthalmology

## 2024-03-29 ENCOUNTER — Encounter: Admitting: Internal Medicine

## 2024-03-29 ENCOUNTER — Ambulatory Visit
Admission: RE | Admit: 2024-03-29 | Discharge: 2024-03-29 | Disposition: A | Discharge status: Home / Self Care | Source: Ambulatory Visit | Attending: Family Medicine | Admitting: Family Medicine

## 2024-03-29 DIAGNOSIS — Z1231 Encounter for screening mammogram for malignant neoplasm of breast: Secondary | ICD-10-CM

## 2024-04-04 ENCOUNTER — Ambulatory Visit: Payer: Self-pay | Admitting: Family Medicine

## 2024-04-05 ENCOUNTER — Encounter: Payer: Self-pay | Admitting: Family Medicine

## 2024-04-05 DIAGNOSIS — J309 Allergic rhinitis, unspecified: Secondary | ICD-10-CM

## 2024-04-05 MED ORDER — FLUTICASONE PROPIONATE 50 MCG/ACT NA SUSP: 2.0000 | Freq: Every day | NASAL | 3 refills | Status: AC

## 2024-04-05 NOTE — Telephone Encounter (Signed)
 Chart reviewed. Rx refilled.

## 2024-04-09 ENCOUNTER — Other Ambulatory Visit: Payer: Self-pay | Admitting: Family Medicine

## 2024-04-09 DIAGNOSIS — F419 Anxiety disorder, unspecified: Secondary | ICD-10-CM

## 2024-04-10 NOTE — Telephone Encounter (Signed)
 Chart reviewed. Rx refilled.

## 2024-04-20 ENCOUNTER — Other Ambulatory Visit: Payer: Self-pay | Admitting: *Deleted

## 2024-04-20 MED ORDER — LOVASTATIN 20 MG PO TABS: ORAL_TABLET | ORAL | 3 refills | Status: AC

## 2024-04-20 NOTE — Telephone Encounter (Signed)
 Chart reviewed. Rx refilled.

## 2024-04-21 ENCOUNTER — Other Ambulatory Visit: Payer: Self-pay

## 2024-04-21 MED ORDER — OYSTER SHELL CALCIUM W/D 500-5 MG-MCG PO TABS: ORAL_TABLET | ORAL | 3 refills | Status: AC

## 2024-04-21 NOTE — Telephone Encounter (Signed)
 Chart reviewed. Rx refilled.

## 2024-04-26 ENCOUNTER — Ambulatory Visit (INDEPENDENT_AMBULATORY_CARE_PROVIDER_SITE_OTHER): Admitting: Family Medicine

## 2024-04-26 ENCOUNTER — Encounter: Payer: Self-pay | Admitting: Family Medicine

## 2024-04-26 ENCOUNTER — Ambulatory Visit: Payer: Self-pay | Admitting: Family Medicine

## 2024-04-26 VITALS — BP 127/78 | HR 100 | Ht 63.0 in | Wt 177.4 lb

## 2024-04-26 DIAGNOSIS — S0990XA Unspecified injury of head, initial encounter: Secondary | ICD-10-CM | POA: Diagnosis present

## 2024-04-26 DIAGNOSIS — E119 Type 2 diabetes mellitus without complications: Secondary | ICD-10-CM

## 2024-04-26 DIAGNOSIS — Z23 Encounter for immunization: Secondary | ICD-10-CM | POA: Diagnosis not present

## 2024-04-26 LAB — POCT GLYCOSYLATED HEMOGLOBIN (HGB A1C): HbA1c, POC (controlled diabetic range): 7.3 % — AB (ref 0.0–7.0)

## 2024-04-26 MED ORDER — METFORMIN HCL 500 MG PO TABS: 1000.0000 mg | ORAL_TABLET | Freq: Two times a day (BID) | ORAL | 1 refills | Status: DC

## 2024-04-26 NOTE — Assessment & Plan Note (Signed)
 Repeat A1c today. Recent normal Cr and uACR. Continue current regimen. Plan for future lab appt for lipid panel.

## 2024-04-26 NOTE — Patient Instructions (Signed)
 Thank you for visiting clinic today and allowing us  to participate in your care!  Your exam today was reassuring. Glad you are doing well overall!   Please call to make a nurse appointment for your flu shot, and a lab appointment for your labwork.   Reach out any time with any questions or concerns you may have - we are here for you!  Damien Cassis, MD [facility] [phone]

## 2024-04-26 NOTE — Progress Notes (Signed)
    SUBJECTIVE:   CHIEF COMPLAINT / HPI:   Head injury - Hit the front of her head on medical bed yesterday - Felt pain, nausea, dizziness at the time like vertigo symptoms  - No LOC, no bleeding  - Took meclizine  this morning, has been feeling better since  - No confusion, weakness, numbness, or tingling   PERTINENT  PMH / PSH: HTN, asthma, T2DM  OBJECTIVE:   BP 127/78   Pulse 100   Ht 5' 3 (1.6 m)   Wt 177 lb 6.4 oz (80.5 kg)   SpO2 100%   BMI 31.42 kg/m   General: No acute distress. Resting comfortably in room. ENT: MMM. Bilateral TM normal-appearing.  CV: Normal S1/S2. No extra heart sounds. Warm and well-perfused. Pulm: Breathing comfortably on room air. CTAB. No increased WOB. Skin:  Warm, dry. Psych: Pleasant and appropriate.  Neuro: Alert and interactive. Normal FTN exam bilaterally. 5/5 strength of upper and lower extremities. Normal bilateral sensation of upper and lower extremities.  CN2: no vision changes CN3,4,6: PERRLA.  CN5: Sensation intact BL CN7: Facial expressions symmetric CN8: Hearing intact BL CN9: palate symmetric  CN10: Normal speech CN11: turns head against resistance CN12: tongue midline   ASSESSMENT/PLAN:   Assessment & Plan Injury of head, initial encounter Reassuring neuro exam today. Low suspicion for acute intracranial process. Discussed sparing use of meclizine  for vertigo.  Controlled type 2 diabetes mellitus without complication, without long-term current use of insulin (HCC) Repeat A1c today. Recent normal Cr and uACR. Continue current regimen. Plan for future lab appt for lipid panel.  Encounter for immunization Plan for future nurse visit for flu vaccine.    Damien Cassis, MD [facility]

## 2024-04-28 ENCOUNTER — Other Ambulatory Visit: Payer: Self-pay | Admitting: Family Medicine

## 2024-04-28 DIAGNOSIS — J302 Other seasonal allergic rhinitis: Secondary | ICD-10-CM

## 2024-04-28 MED ORDER — METFORMIN HCL 1000 MG PO TABS: 1000.0000 mg | ORAL_TABLET | Freq: Two times a day (BID) | ORAL | 5 refills | Status: AC

## 2024-04-28 NOTE — Progress Notes (Signed)
 Patient metformin  rx previously with 500 mg tablets. Updated to 1000 mg tablets. Continue same dosage metformin  1000 mg BID.

## 2024-04-28 NOTE — Telephone Encounter (Signed)
 Chart reviewed. Rx refilled.

## 2024-05-01 ENCOUNTER — Ambulatory Visit (INDEPENDENT_AMBULATORY_CARE_PROVIDER_SITE_OTHER)

## 2024-05-01 DIAGNOSIS — Z23 Encounter for immunization: Secondary | ICD-10-CM | POA: Diagnosis present

## 2024-05-04 ENCOUNTER — Other Ambulatory Visit: Payer: Self-pay | Admitting: Family Medicine

## 2024-05-04 NOTE — Telephone Encounter (Signed)
 Chart reviewed. Rx refilled.

## 2024-06-13 ENCOUNTER — Telehealth: Payer: Self-pay

## 2024-06-13 NOTE — Telephone Encounter (Signed)
 Patients Jardiance  assistance with [employer] ends 08/02/24.

## 2024-06-16 ENCOUNTER — Other Ambulatory Visit (HOSPITAL_COMMUNITY): Payer: Self-pay

## 2024-06-27 NOTE — Telephone Encounter (Signed)
 PAP: Patient assistance application for Jardiance  through [employer]) has been mailed to pt's home address on file.

## 2024-07-03 ENCOUNTER — Other Ambulatory Visit: Payer: Self-pay | Admitting: *Deleted

## 2024-07-04 MED ORDER — LOSARTAN POTASSIUM 100 MG PO TABS: 100.0000 mg | ORAL_TABLET | Freq: Every day | ORAL | 3 refills | Status: AC

## 2024-07-31 ENCOUNTER — Encounter: Payer: Self-pay | Admitting: Family Medicine

## 2024-08-02 MED ORDER — HYDROCHLOROTHIAZIDE 25 MG PO TABS: ORAL_TABLET | ORAL | 0 refills | Status: AC

## 2024-08-02 NOTE — Telephone Encounter (Signed)
 Chart reviewed. Rx refilled.

## 2024-08-10 NOTE — Telephone Encounter (Signed)
 Patient rec'd all paperwork and will drop off at office.   Will provide tax statement and will apply for LIS as well.

## 2024-08-23 ENCOUNTER — Other Ambulatory Visit: Payer: Self-pay | Admitting: Family Medicine

## 2024-08-23 DIAGNOSIS — G629 Polyneuropathy, unspecified: Secondary | ICD-10-CM

## 2024-08-23 DIAGNOSIS — F419 Anxiety disorder, unspecified: Secondary | ICD-10-CM

## 2024-08-24 NOTE — Telephone Encounter (Signed)
 Chart reviewed. Rx refilled.

## 2024-12-04 ENCOUNTER — Encounter
